# Patient Record
Sex: Male | Born: 1960 | Hispanic: No | Smoking: Never smoker
Health system: Southern US, Community
[De-identification: ages and names within clinical notes are randomized; demographics above are authoritative.]

## PROBLEM LIST (undated history)

## (undated) DIAGNOSIS — I10 Essential (primary) hypertension: Secondary | ICD-10-CM

## (undated) DIAGNOSIS — E0590 Thyrotoxicosis, unspecified without thyrotoxic crisis or storm: Secondary | ICD-10-CM

## (undated) DIAGNOSIS — G4730 Sleep apnea, unspecified: Secondary | ICD-10-CM

## (undated) HISTORY — PX: APPENDECTOMY: SHX54

---

## 2018-06-01 ENCOUNTER — Emergency Department (HOSPITAL_COMMUNITY): Payer: No Typology Code available for payment source

## 2018-06-01 ENCOUNTER — Other Ambulatory Visit: Payer: Self-pay

## 2018-06-01 ENCOUNTER — Observation Stay (HOSPITAL_COMMUNITY): Payer: No Typology Code available for payment source

## 2018-06-01 ENCOUNTER — Emergency Department (HOSPITAL_COMMUNITY): Payer: No Typology Code available for payment source | Admitting: Certified Registered"

## 2018-06-01 ENCOUNTER — Observation Stay (HOSPITAL_COMMUNITY)
Admission: EM | Admit: 2018-06-01 | Discharge: 2018-06-02 | Disposition: A | Discharge status: Home / Self Care | Payer: No Typology Code available for payment source | Attending: Orthopedic Surgery | Admitting: Orthopedic Surgery

## 2018-06-01 ENCOUNTER — Inpatient Hospital Stay: Admit: 2018-06-01 | Payer: Self-pay | Admitting: Orthopedic Surgery

## 2018-06-01 ENCOUNTER — Encounter (HOSPITAL_COMMUNITY): Payer: Self-pay

## 2018-06-01 ENCOUNTER — Encounter (HOSPITAL_COMMUNITY)
Admission: EM | Disposition: A | Discharge status: Home / Self Care | Payer: Self-pay | Source: Home / Self Care | Attending: Emergency Medicine

## 2018-06-01 DIAGNOSIS — S89101A Unspecified physeal fracture of lower end of right tibia, initial encounter for closed fracture: Secondary | ICD-10-CM | POA: Diagnosis present

## 2018-06-01 DIAGNOSIS — Y990 Civilian activity done for income or pay: Secondary | ICD-10-CM | POA: Diagnosis not present

## 2018-06-01 DIAGNOSIS — S82401A Unspecified fracture of shaft of right fibula, initial encounter for closed fracture: Secondary | ICD-10-CM | POA: Diagnosis not present

## 2018-06-01 DIAGNOSIS — Z79899 Other long term (current) drug therapy: Secondary | ICD-10-CM | POA: Diagnosis not present

## 2018-06-01 DIAGNOSIS — I10 Essential (primary) hypertension: Secondary | ICD-10-CM | POA: Insufficient documentation

## 2018-06-01 DIAGNOSIS — E039 Hypothyroidism, unspecified: Secondary | ICD-10-CM | POA: Diagnosis not present

## 2018-06-01 DIAGNOSIS — X501XXA Overexertion from prolonged static or awkward postures, initial encounter: Secondary | ICD-10-CM | POA: Diagnosis not present

## 2018-06-01 DIAGNOSIS — Z6834 Body mass index (BMI) 34.0-34.9, adult: Secondary | ICD-10-CM | POA: Diagnosis not present

## 2018-06-01 DIAGNOSIS — S82301A Unspecified fracture of lower end of right tibia, initial encounter for closed fracture: Principal | ICD-10-CM | POA: Insufficient documentation

## 2018-06-01 DIAGNOSIS — G4730 Sleep apnea, unspecified: Secondary | ICD-10-CM | POA: Insufficient documentation

## 2018-06-01 DIAGNOSIS — Z79890 Hormone replacement therapy (postmenopausal): Secondary | ICD-10-CM | POA: Insufficient documentation

## 2018-06-01 DIAGNOSIS — S82201A Unspecified fracture of shaft of right tibia, initial encounter for closed fracture: Secondary | ICD-10-CM | POA: Diagnosis not present

## 2018-06-01 DIAGNOSIS — Z7982 Long term (current) use of aspirin: Secondary | ICD-10-CM | POA: Diagnosis not present

## 2018-06-01 DIAGNOSIS — Z419 Encounter for procedure for purposes other than remedying health state, unspecified: Secondary | ICD-10-CM

## 2018-06-01 DIAGNOSIS — S82209A Unspecified fracture of shaft of unspecified tibia, initial encounter for closed fracture: Secondary | ICD-10-CM | POA: Diagnosis present

## 2018-06-01 HISTORY — DX: Essential (primary) hypertension: I10

## 2018-06-01 HISTORY — DX: Sleep apnea, unspecified: G47.30

## 2018-06-01 HISTORY — DX: Thyrotoxicosis, unspecified without thyrotoxic crisis or storm: E05.90

## 2018-06-01 HISTORY — PX: TIBIA IM NAIL INSERTION: SHX2516

## 2018-06-01 LAB — BASIC METABOLIC PANEL
ANION GAP: 9 (ref 5–15)
BUN: 10 mg/dL (ref 6–20)
CALCIUM: 9.1 mg/dL (ref 8.9–10.3)
CO2: 26 mmol/L (ref 22–32)
CREATININE: 1.06 mg/dL (ref 0.61–1.24)
Chloride: 106 mmol/L (ref 98–111)
GFR calc Af Amer: >60 mL/min (ref >60)
GFR calc non Af Amer: >60 mL/min (ref >60)
GLUCOSE: 112 mg/dL — AB (ref 70–99)
Potassium: 4.1 mmol/L (ref 3.5–5.1)
Sodium: 141 mmol/L (ref 135–145)

## 2018-06-01 LAB — CBC WITH DIFFERENTIAL/PLATELET
BASOS ABS: 0.1 10*3/uL (ref 0.0–0.1)
BASOS PCT: 1 %
EOS ABS: 0.1 10*3/uL (ref 0.0–0.7)
EOS PCT: 1 %
HCT: 46.2 % (ref 39.0–52.0)
Hemoglobin: 15.7 g/dL (ref 13.0–17.0)
Lymphocytes Relative: 11 %
Lymphs Abs: 1.2 10*3/uL (ref 0.7–4.0)
MCH: 31.2 pg (ref 26.0–34.0)
MCHC: 34 g/dL (ref 30.0–36.0)
MCV: 91.7 fL (ref 78.0–100.0)
MONO ABS: 0.6 10*3/uL (ref 0.1–1.0)
Monocytes Relative: 5 %
Neutro Abs: 9.1 10*3/uL — ABNORMAL HIGH (ref 1.7–7.7)
Neutrophils Relative %: 82 %
PLATELETS: 253 10*3/uL (ref 150–400)
RBC: 5.04 MIL/uL (ref 4.22–5.81)
RDW: 13.2 % (ref 11.5–15.5)
WBC: 11 10*3/uL — ABNORMAL HIGH (ref 4.0–10.5)

## 2018-06-01 LAB — PROTIME-INR
INR: 0.92
Prothrombin Time: 12.3 seconds (ref 11.4–15.2)

## 2018-06-01 SURGERY — INSERTION, INTRAMEDULLARY ROD, TIBIA
Anesthesia: General | Site: Leg Lower | Laterality: Right

## 2018-06-01 MED ORDER — OXYCODONE HCL 5 MG PO TABS
ORAL_TABLET | ORAL | Status: AC
  Filled 2018-06-01: qty 1

## 2018-06-01 MED ORDER — ONDANSETRON HCL 4 MG/2ML IJ SOLN
4.0000 mg | INTRAMUSCULAR | Status: DC | PRN
  Administered 2018-06-01: 4 mg via INTRAVENOUS
  Filled 2018-06-01: qty 2

## 2018-06-01 MED ORDER — DEXAMETHASONE SODIUM PHOSPHATE 10 MG/ML IJ SOLN
INTRAMUSCULAR | Status: DC | PRN
  Administered 2018-06-01: 10 mg via INTRAVENOUS

## 2018-06-01 MED ORDER — CEFAZOLIN SODIUM-DEXTROSE 2-4 GM/100ML-% IV SOLN
2.0000 g | Freq: Once | INTRAVENOUS | Status: AC
  Administered 2018-06-01: 2 g via INTRAVENOUS

## 2018-06-01 MED ORDER — SODIUM CHLORIDE 0.9 % IR SOLN
Status: DC | PRN
  Administered 2018-06-01 (×3): 1000 mL

## 2018-06-01 MED ORDER — LACTATED RINGERS IV SOLN
INTRAVENOUS | Status: DC
  Administered 2018-06-01 (×3): via INTRAVENOUS

## 2018-06-01 MED ORDER — SUCCINYLCHOLINE CHLORIDE 20 MG/ML IJ SOLN
INTRAMUSCULAR | Status: DC | PRN
  Administered 2018-06-01: 80 mg via INTRAVENOUS

## 2018-06-01 MED ORDER — CEFAZOLIN SODIUM-DEXTROSE 2-4 GM/100ML-% IV SOLN: 2.0000 g | INTRAVENOUS | Status: DC

## 2018-06-01 MED ORDER — HYDROMORPHONE HCL 1 MG/ML IJ SOLN
1.0000 mg | INTRAMUSCULAR | Status: DC | PRN
  Administered 2018-06-01: 1 mg via INTRAVENOUS
  Filled 2018-06-01: qty 1

## 2018-06-01 MED ORDER — SODIUM CHLORIDE 0.9 % IV SOLN: Freq: Once | INTRAVENOUS | Status: DC

## 2018-06-01 MED ORDER — FENTANYL CITRATE (PF) 100 MCG/2ML IJ SOLN
25.0000 ug | INTRAMUSCULAR | Status: DC | PRN
  Administered 2018-06-01 (×3): 25 ug via INTRAVENOUS

## 2018-06-01 MED ORDER — ONDANSETRON HCL 4 MG/2ML IJ SOLN
INTRAMUSCULAR | Status: DC | PRN
  Administered 2018-06-01: 4 mg via INTRAVENOUS

## 2018-06-01 MED ORDER — MIDAZOLAM HCL 2 MG/2ML IJ SOLN
INTRAMUSCULAR | Status: AC
  Filled 2018-06-01: qty 2

## 2018-06-01 MED ORDER — CLONIDINE HCL (ANALGESIA) 100 MCG/ML EP SOLN
150.0000 ug | Freq: Once | EPIDURAL | Status: DC
  Filled 2018-06-01: qty 10

## 2018-06-01 MED ORDER — OXYCODONE HCL 5 MG PO TABS
5.0000 mg | ORAL_TABLET | Freq: Once | ORAL | Status: AC | PRN
  Administered 2018-06-01: 5 mg via ORAL

## 2018-06-01 MED ORDER — OXYCODONE HCL 5 MG/5ML PO SOLN: 5.0000 mg | Freq: Once | ORAL | Status: AC | PRN

## 2018-06-01 MED ORDER — PROPOFOL 10 MG/ML IV BOLUS
INTRAVENOUS | Status: AC
  Filled 2018-06-01: qty 20

## 2018-06-01 MED ORDER — CEFAZOLIN SODIUM-DEXTROSE 2-4 GM/100ML-% IV SOLN
INTRAVENOUS | Status: AC
  Filled 2018-06-01: qty 100

## 2018-06-01 MED ORDER — CHLORHEXIDINE GLUCONATE 4 % EX LIQD: 60.0000 mL | Freq: Once | CUTANEOUS | Status: DC

## 2018-06-01 MED ORDER — FENTANYL CITRATE (PF) 100 MCG/2ML IJ SOLN
INTRAMUSCULAR | Status: DC | PRN
  Administered 2018-06-01: 50 ug via INTRAVENOUS
  Administered 2018-06-01 (×2): 100 ug via INTRAVENOUS

## 2018-06-01 MED ORDER — LIDOCAINE HCL (CARDIAC) PF 100 MG/5ML IV SOSY
PREFILLED_SYRINGE | INTRAVENOUS | Status: DC | PRN
  Administered 2018-06-01: 60 mg via INTRAVENOUS

## 2018-06-01 MED ORDER — BUPIVACAINE HCL (PF) 0.5 % IJ SOLN
INTRAMUSCULAR | Status: DC | PRN
  Administered 2018-06-01: 30 mL

## 2018-06-01 MED ORDER — BUPIVACAINE HCL (PF) 0.5 % IJ SOLN
INTRAMUSCULAR | Status: AC
  Filled 2018-06-01: qty 30

## 2018-06-01 MED ORDER — MIDAZOLAM HCL 5 MG/5ML IJ SOLN
INTRAMUSCULAR | Status: DC | PRN
  Administered 2018-06-01: 2 mg via INTRAVENOUS

## 2018-06-01 MED ORDER — PROPOFOL 10 MG/ML IV BOLUS
INTRAVENOUS | Status: DC | PRN
  Administered 2018-06-01: 20 mg via INTRAVENOUS
  Administered 2018-06-01: 120 mg via INTRAVENOUS

## 2018-06-01 MED ORDER — BUPIVACAINE HCL (PF) 0.25 % IJ SOLN
INTRAMUSCULAR | Status: AC
  Filled 2018-06-01: qty 30

## 2018-06-01 MED ORDER — POVIDONE-IODINE 10 % EX SWAB
2.0000 "application " | Freq: Once | CUTANEOUS | Status: AC
  Administered 2018-06-01: 2 via TOPICAL

## 2018-06-01 MED ORDER — FENTANYL CITRATE (PF) 100 MCG/2ML IJ SOLN
INTRAMUSCULAR | Status: AC
  Filled 2018-06-01: qty 2

## 2018-06-01 MED ORDER — FENTANYL CITRATE (PF) 250 MCG/5ML IJ SOLN
INTRAMUSCULAR | Status: AC
  Filled 2018-06-01: qty 5

## 2018-06-01 MED ORDER — MORPHINE SULFATE (PF) 4 MG/ML IV SOLN
INTRAVENOUS | Status: AC
  Filled 2018-06-01: qty 2

## 2018-06-01 SURGICAL SUPPLY — 73 items
BANDAGE ACE 4X5 VEL STRL LF (GAUZE/BANDAGES/DRESSINGS) ×3 IMPLANT
BANDAGE ACE 6X5 VEL STRL LF (GAUZE/BANDAGES/DRESSINGS) ×3 IMPLANT
BANDAGE ELASTIC 4 VELCRO ST LF (GAUZE/BANDAGES/DRESSINGS) ×2 IMPLANT
BANDAGE ESMARK 6X9 LF (GAUZE/BANDAGES/DRESSINGS) IMPLANT
BIT DRILL AO GAMMA 4.2X180 (BIT) ×4 IMPLANT
BIT DRILL AO GAMMA 4.2X340 (BIT) ×2 IMPLANT
BLADE CLIPPER SURG (BLADE) IMPLANT
BLADE SURG 15 STRL LF DISP TIS (BLADE) ×1 IMPLANT
BLADE SURG 15 STRL SS (BLADE) ×3
BNDG CMPR 9X6 STRL LF SNTH (GAUZE/BANDAGES/DRESSINGS)
BNDG COHESIVE 6X5 TAN STRL LF (GAUZE/BANDAGES/DRESSINGS) ×3 IMPLANT
BNDG ESMARK 6X9 LF (GAUZE/BANDAGES/DRESSINGS)
BNDG GAUZE ELAST 4 BULKY (GAUZE/BANDAGES/DRESSINGS) ×3 IMPLANT
COVER SURGICAL LIGHT HANDLE (MISCELLANEOUS) ×3 IMPLANT
CUFF TOURNIQUET SINGLE 34IN LL (TOURNIQUET CUFF) IMPLANT
CUFF TOURNIQUET SINGLE 44IN (TOURNIQUET CUFF) IMPLANT
DRAPE C-ARM 42X72 X-RAY (DRAPES) ×3 IMPLANT
DRAPE C-ARMOR (DRAPES) ×4 IMPLANT
DRAPE IMP U-DRAPE 54X76 (DRAPES) ×3 IMPLANT
DRAPE INCISE IOBAN 66X45 STRL (DRAPES) ×6 IMPLANT
DRAPE ORTHO SPLIT 77X108 STRL (DRAPES) ×6
DRAPE SURG ORHT 6 SPLT 77X108 (DRAPES) ×2 IMPLANT
DRAPE U-SHAPE 47X51 STRL (DRAPES) ×3 IMPLANT
DRSG TEGADERM 2-3/8X2-3/4 SM (GAUZE/BANDAGES/DRESSINGS) ×8 IMPLANT
DRSG TEGADERM 4X4.75 (GAUZE/BANDAGES/DRESSINGS) ×2 IMPLANT
DURAPREP 26ML APPLICATOR (WOUND CARE) ×3 IMPLANT
ELECT REM PT RETURN 9FT ADLT (ELECTROSURGICAL) ×3
ELECTRODE REM PT RTRN 9FT ADLT (ELECTROSURGICAL) ×1 IMPLANT
END CAP TIBIAL T2 11.5X5 (CAP) ×3 IMPLANT
GAUZE SPONGE 4X4 12PLY STRL (GAUZE/BANDAGES/DRESSINGS) ×6 IMPLANT
GAUZE SPONGE 4X4 12PLY STRL LF (GAUZE/BANDAGES/DRESSINGS) ×2 IMPLANT
GAUZE XEROFORM 5X9 LF (GAUZE/BANDAGES/DRESSINGS) ×3 IMPLANT
GLOVE BIOGEL PI IND STRL 8 (GLOVE) ×1 IMPLANT
GLOVE BIOGEL PI INDICATOR 8 (GLOVE) ×2
GLOVE SURG ORTHO 7.0 STRL STRW (GLOVE) ×2 IMPLANT
GLOVE SURG ORTHO 8.0 STRL STRW (GLOVE) ×3 IMPLANT
GOWN STRL REUS W/ TWL LRG LVL3 (GOWN DISPOSABLE) ×1 IMPLANT
GOWN STRL REUS W/ TWL XL LVL3 (GOWN DISPOSABLE) ×2 IMPLANT
GOWN STRL REUS W/TWL LRG LVL3 (GOWN DISPOSABLE) ×3
GOWN STRL REUS W/TWL XL LVL3 (GOWN DISPOSABLE) ×6
GUIDEROD T2 3X1000 (ROD) ×2 IMPLANT
GUIDEWIRE GAMMA (WIRE) ×4 IMPLANT
K-WIRE FIXATION 3X285 COATED (WIRE) ×6
KIT BASIN OR (CUSTOM PROCEDURE TRAY) ×3 IMPLANT
KIT TURNOVER KIT B (KITS) ×3 IMPLANT
KWIRE FIXATION 3X285 COATED (WIRE) IMPLANT
MANIFOLD NEPTUNE II (INSTRUMENTS) ×3 IMPLANT
NAIL ELAS INSERT SLV SPI 8-11 (MISCELLANEOUS) ×2 IMPLANT
NAIL TIBIAL STD 11X360MM (Nail) ×2 IMPLANT
NS IRRIG 1000ML POUR BTL (IV SOLUTION) ×3 IMPLANT
PACK GENERAL/GYN (CUSTOM PROCEDURE TRAY) ×3 IMPLANT
PACK UNIVERSAL I (CUSTOM PROCEDURE TRAY) ×3 IMPLANT
PAD ARMBOARD 7.5X6 YLW CONV (MISCELLANEOUS) ×6 IMPLANT
PAD CAST 4YDX4 CTTN HI CHSV (CAST SUPPLIES) IMPLANT
PADDING CAST COTTON 4X4 STRL (CAST SUPPLIES) ×3
REAMER INTRAMEDULLARY 8MM 510 (MISCELLANEOUS) ×2 IMPLANT
SCREW LOCKING T2 F/T  5MMX40MM (Screw) ×2 IMPLANT
SCREW LOCKING T2 F/T  5MMX50MM (Screw) ×4 IMPLANT
SCREW LOCKING T2 F/T  5MMX55MM (Screw) ×2 IMPLANT
SCREW LOCKING T2 F/T 5MMX40MM (Screw) IMPLANT
SCREW LOCKING T2 F/T 5MMX50MM (Screw) IMPLANT
SCREW LOCKING T2 F/T 5MMX55MM (Screw) IMPLANT
SPLINT PLASTER CAST XFAST 5X30 (CAST SUPPLIES) IMPLANT
SPLINT PLASTER XFAST SET 5X30 (CAST SUPPLIES) ×4
STOCKINETTE IMPERVIOUS LG (DRAPES) ×3 IMPLANT
SUT ETHILON 3 0 PS 1 (SUTURE) ×4 IMPLANT
SUT VIC AB 0 CT1 27 (SUTURE) ×6
SUT VIC AB 0 CT1 27XBRD ANBCTR (SUTURE) ×2 IMPLANT
SUT VIC AB 2-0 CT1 27 (SUTURE) ×3
SUT VIC AB 2-0 CT1 TAPERPNT 27 (SUTURE) ×1 IMPLANT
TOWEL OR 17X26 10 PK STRL BLUE (TOWEL DISPOSABLE) ×3 IMPLANT
TRAY FOLEY MTR SLVR 16FR STAT (SET/KITS/TRAYS/PACK) IMPLANT
WATER STERILE IRR 1000ML POUR (IV SOLUTION) ×3 IMPLANT

## 2018-06-01 NOTE — Anesthesia Procedure Notes (Signed)
Procedure Name: Intubation Date/Time: 06/01/2018 8:21 PM Performed by: Babs Bertin, CRNA Pre-anesthesia Checklist: Patient identified, Emergency Drugs available, Suction available and Patient being monitored Patient Re-evaluated:Patient Re-evaluated prior to induction Oxygen Delivery Method: Circle System Utilized Preoxygenation: Pre-oxygenation with 100% oxygen Induction Type: IV induction and Rapid sequence Laryngoscope Size: Mac and 4 Grade View: Grade III Tube type: Oral Tube size: 7.5 mm Number of attempts: 1 Airway Equipment and Method: Stylet and Oral airway Placement Confirmation: ETT inserted through vocal cords under direct vision,  positive ETCO2 and breath sounds checked- equal and bilateral Secured at: 22 cm Tube secured with: Tape Dental Injury: Teeth and Oropharynx as per pre-operative assessment

## 2018-06-01 NOTE — Anesthesia Preprocedure Evaluation (Signed)
Anesthesia Evaluation  Patient identified by MRN, date of birth, ID band Patient awake    Reviewed: Allergy & Precautions, NPO status , Patient's Chart, lab work & pertinent test results  History of Anesthesia Complications Negative for: history of anesthetic complications  Airway Mallampati: IV  TM Distance: >3 FB Neck ROM: Full    Dental  (+) Teeth Intact   Pulmonary sleep apnea and Continuous Positive Airway Pressure Ventilation ,    breath sounds clear to auscultation       Cardiovascular hypertension, Pt. on medications (-) angina(-) Past MI and (-) CHF  Rhythm:Regular     Neuro/Psych negative neurological ROS  negative psych ROS   GI/Hepatic negative GI ROS, Neg liver ROS,   Endo/Other  Hypothyroidism Morbid obesity  Renal/GU negative Renal ROS     Musculoskeletal Right tib/fib fx   Abdominal   Peds  Hematology   Anesthesia Other Findings   Reproductive/Obstetrics                             Anesthesia Physical Anesthesia Plan  ASA: II  Anesthesia Plan: General   Post-op Pain Management:    Induction: Intravenous  PONV Risk Score and Plan: 2 and Ondansetron and Dexamethasone  Airway Management Planned: Oral ETT  Additional Equipment: None  Intra-op Plan:   Post-operative Plan: Extubation in OR  Informed Consent: I have reviewed the patients History and Physical, chart, labs and discussed the procedure including the risks, benefits and alternatives for the proposed anesthesia with the patient or authorized representative who has indicated his/her understanding and acceptance.   Dental advisory given  Plan Discussed with: CRNA and Surgeon  Anesthesia Plan Comments:         Anesthesia Quick Evaluation

## 2018-06-01 NOTE — Brief Op Note (Signed)
06/01/2018  10:57 PM  PATIENT:  Carlos RoupVincent Adkins  57 y.o. male  PRE-OPERATIVE DIAGNOSIS:  RIGHT TIB/FIB FRACTURE  POST-OPERATIVE DIAGNOSIS:  RIGHT TIB/FIB FRACTURE  PROCEDURE:  Procedure(s): INTRAMEDULLARY (IM) NAIL TIBIAL  SURGEON:  Surgeon(s): August Saucerean, Corrie MckusickGregory Scott, MD  ASSISTANT: None  ANESTHESIA:   general  EBL: 100 ml    Total I/O In: 1700 [I.V.:1700] Out: 350 [Urine:300; Blood:50]  BLOOD ADMINISTERED: none  DRAINS: none   LOCAL MEDICATIONS USED: Plain Marcaine morphine clonidine  SPECIMEN:  No Specimen  COUNTS:  YES  TOURNIQUET:  * Missing tourniquet times found for documented tourniquets in log: [id number] *  DICTATION: . [id number]. [id number] PLAN OF CARE: Admit for overnight observation  PATIENT DISPOSITION:  PACU - hemodynamically stable

## 2018-06-01 NOTE — H&P (Signed)
Carlos Adkins is an 57 y.o. male.   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  He was getting out of his truck and he twisted his right leg.  The right leg was pinned between tanks and he felt a pop.  Subsequent evaluation in the emergency room demonstrates spiral tib-fib fracture which is displaced.  He denies any numbness and tingling in his toes.  No family or personal history of DVT or pulmonary embolism.  He does take 1 baby aspirin twice a day.  Past Medical History:  Diagnosis Date  . Hypertension   . Hyperthyroidism   . Sleep apnea     Past Surgical History:  Procedure Laterality Date  . APPENDECTOMY      History reviewed. No pertinent family history. Social History:  reports that he has never smoked. He has never used smokeless tobacco. His alcohol and drug histories are not on file.  Allergies: No Known Allergies  Medications Prior to Admission  Medication Sig Dispense Refill  . aspirin EC 81 MG tablet Take 162 mg by mouth at bedtime.    Marland Kitchen GARLIC PO Take 2 capsules by mouth daily.    Marland Kitchen levothyroxine (SYNTHROID, LEVOTHROID) 137 MCG tablet Take 137 mcg by mouth daily.  0  . metoprolol succinate (TOPROL-XL) 25 MG 24 hr tablet Take 25 mg by mouth every evening.   6    Results for orders placed or performed during the hospital encounter of 06/01/18 (from the past 48 hour(s))  Basic metabolic panel     Status: Abnormal   Collection Time: 06/01/18  6:08 PM  Result Value Ref Range   Sodium 141 135 - 145 mmol/L   Potassium 4.1 3.5 - 5.1 mmol/L   Chloride 106 98 - 111 mmol/L    Comment: Please note change in reference range.   CO2 26 22 - 32 mmol/L   Glucose, Bld 112 (H) 70 - 99 mg/dL    Comment: Please note change in reference range.   BUN 10 6 - 20 mg/dL    Comment: Please note change in reference range.   Creatinine, Ser 1.06 0.61 - 1.24 mg/dL   Calcium 9.1 8.9 - 10.3 mg/dL   GFR calc  non Af Amer >60 >60 mL/min   GFR calc Af Amer >60 >60 mL/min    Comment: (NOTE) The eGFR has been calculated using the CKD EPI equation. This calculation has not been validated in all clinical situations. eGFR's persistently <60 mL/min signify possible Chronic Kidney Disease.    Anion gap 9 5 - 15    Comment: Performed at [facility], [place] [address], [place], [place] [address]  CBC with Differential     Status: Abnormal   Collection Time: 06/01/18  6:08 PM  Result Value Ref Range   WBC 11.0 (H) 4.0 - 10.5 K/uL   RBC 5.04 4.22 - 5.81 MIL/uL   Hemoglobin 15.7 13.0 - 17.0 g/dL   HCT 46.2 39.0 - 52.0 %   MCV 91.7 78.0 - 100.0 fL   MCH 31.2 26.0 - 34.0 pg   MCHC 34.0 30.0 - 36.0 g/dL   RDW 13.2 11.5 - 15.5 %   Platelets 253 150 - 400 K/uL   Neutrophils Relative % 82 %   Neutro Abs 9.1 (H) 1.7 - 7.7 K/uL   Lymphocytes Relative 11 %   Lymphs Abs 1.2 0.7 - 4.0 K/uL  Monocytes Relative 5 %   Monocytes Absolute 0.6 0.1 - 1.0 K/uL   Eosinophils Relative 1 %   Eosinophils Absolute 0.1 0.0 - 0.7 K/uL   Basophils Relative 1 %   Basophils Absolute 0.1 0.0 - 0.1 K/uL    Comment: Performed at [facility], [place] [address] [place], [place] [address]  Protime-INR     Status: None   Collection Time: 06/01/18  6:08 PM  Result Value Ref Range   Prothrombin Time 12.3 11.4 - 15.2 seconds   INR 0.92     Comment: Performed at [facility], [place] [address], [place], [place] [address]   Dg Tibia/fibula Right  Result Date: 06/01/2018 CLINICAL DATA:  Fall, leg injury EXAM: RIGHT TIBIA AND FIBULA - 2 VIEW COMPARISON:  None. FINDINGS: Mid fibular shaft fracture, with 1 shaft width medial displacement of the distal fracture fragment and apex medial angulation. Distal tibial shaft fracture, with approximately 1/2 shaft width lateral displacement of the distal fracture fragment. Mild soft tissue swelling. IMPRESSION: Fibular and tibial shaft fractures,  as above. Electronically Signed   By: Julian Hy M.D.   On: 06/01/2018 16:56   Dg Ankle Complete Right  Result Date: 06/01/2018 CLINICAL DATA:  Fall, right leg injury EXAM: RIGHT ANKLE - COMPLETE 3+ VIEW COMPARISON:  None. FINDINGS: Displaced oblique fracture of the distal tibial metaphysis. Approximately 1/2 shaft with lateral displacement of the distal fracture fragment. The ankle mortise is intact. The base of the fifth metatarsal is unremarkable. The visualized soft tissues are unremarkable. IMPRESSION: Oblique distal tibial fracture, better evaluated on dedicated tib/fib radiographs. Electronically Signed   By: Julian Hy M.D.   On: 06/01/2018 16:55   Dg Chest Port 1 View  Result Date: 06/01/2018 CLINICAL DATA:  Preop for lower extremity fracture EXAM: PORTABLE CHEST 1 VIEW COMPARISON:  None. FINDINGS: The heart size and mediastinal contours are within normal limits. Both lungs are clear. The visualized skeletal structures are unremarkable. IMPRESSION: No active disease. Electronically Signed   By: Ulyses Jarred M.D.   On: 06/01/2018 18:01    Review of Systems  Musculoskeletal: Positive for joint pain.  All other systems reviewed and are negative.   Blood pressure (!) 134/93, pulse (!) 133, temperature 98.4 F (36.9 C), temperature source Oral, resp. rate (!) 23, height 6' 1"  (1.854 m), weight 262 lb (118.8 kg), SpO2 98 %. Physical Exam  Constitutional: He appears well-developed.  HENT:  Head: Normocephalic.  Eyes: Pupils are equal, round, and reactive to light.  Neck: Normal range of motion.  Cardiovascular: Normal rate.  Respiratory: Effort normal.  Neurological: He is alert.  Skin: Skin is warm.  Psychiatric: He has a normal mood and affect.    Ortho exam demonstrates splinted right lower extremity.  No pain with passive dorsiflexion plantarflexion of the toes.  Foot is perfused and sensate on the dorsal and plantar aspect.  No groin pain on the right with  internal/external rotation of the leg. Assessment/Plan Impression is displaced right tib-fib fracture.  Plan is intramedullary nailing.  Risk and benefits are discussed including but not limited to infection nerve vessel damage nonunion malunion as well as need for more surgery.  Patient understands risk benefits.  All questions answered  Anderson Malta, MD 06/01/2018, 7:52 PM

## 2018-06-01 NOTE — ED Notes (Signed)
Bed: WA06 Expected date:  Expected time:  Means of arrival:  Comments: 56 Ankle Injury

## 2018-06-01 NOTE — Transfer of Care (Signed)
Immediate Anesthesia Transfer of Care Note  Patient: Carlos Adkins  Procedure(s) Performed: INTRAMEDULLARY (IM) NAIL TIBIAL (Right Leg Lower)  Patient Location: PACU  Anesthesia Type:General  Level of Consciousness: responds to stimulation  Airway & Oxygen Therapy: Patient Spontanous Breathing and Patient connected to nasal cannula oxygen  Post-op Assessment: Report given to RN and Post -op Vital signs reviewed and stable  Post vital signs: Reviewed and stable  Last Vitals:  Vitals Value Taken Time  BP 118/71 06/01/2018 10:50 PM  Temp 36.1 C 06/01/2018 10:48 PM  Pulse 79 06/01/2018 10:53 PM  Resp 12 06/01/2018 10:53 PM  SpO2 94 % 06/01/2018 10:53 PM  Vitals shown include unvalidated device data.  Last Pain:  Vitals:   06/01/18 1835  TempSrc:   PainSc: 6          Complications: No apparent anesthesia complications

## 2018-06-01 NOTE — ED Triage Notes (Signed)
Patient BIB EMS from work at [employer]. 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 Palpable equal pulses to bilateral pedal pulses.

## 2018-06-01 NOTE — ED Provider Notes (Signed)
[facility]-EMERGENCY DEPT Provider Note   CSN: [id number] Arrival date & time: 06/01/18  1604     History   Chief Complaint Chief Complaint  Patient presents with  . Ankle Pain    HPI Carlos Adkins is a 57 y.o. male.  HPI Patient had been loading barrels into the bed of a pickup truck.  He was getting ready to jump off a truck and caught his right foot between 2 barrels which caused his leg to twist and him to fall to the edge of the truck.  He heard a snap and a pop in his leg.  He has pain in the mid leg.  A friend caught him.  He denies any significant fall or injury to any other area of his body.  Denies he struck his head.  No headache, no neck pain, no chest pain, no back pain.  Patient takes 2 baby aspirin daily.  No other anticoagulants.  Last ate a meal around 1230.  Past medical history: Hypertension Hypothyroidism Appendectomy  History reviewed. No pertinent past medical history.  There are no active problems to display for this patient.    The histories are not reviewed yet. Please review them in the "History" navigator section and refresh this SmartLink.      Home Medications    Prior to Admission medications   Medication Sig Start Date End Date Taking? Authorizing Provider  aspirin EC 81 MG tablet Take 162 mg by mouth at bedtime.   Yes [provider]  GARLIC PO Take 2 capsules by mouth daily.   Yes [provider]  levothyroxine (SYNTHROID, LEVOTHROID) 137 MCG tablet Take 137 mcg by mouth daily. 05/16/18  Yes [provider]  metoprolol succinate (TOPROL-XL) 25 MG 24 hr tablet Take 25 mg by mouth every evening.  04/08/18  Yes [provider]    Family History History reviewed. No pertinent family history.  Social History Social History   Tobacco Use  . Smoking status: Not on file  Substance Use Topics  . Alcohol use: Not on file  . Drug use: Not on file     Allergies   Patient has no  known allergies.   Review of Systems Review of Systems 10 Systems reviewed and are negative for acute change except as noted in the HPI.   Physical Exam Updated Vital Signs BP 121/75 (BP Location: Right Arm)   Pulse 78   Temp 98.4 F (36.9 C) (Oral)   Resp 17   Ht 6\' 1"  (1.854 m)   Wt 118.8 kg (262 lb)   SpO2 98%   BMI 34.57 kg/m   Physical Exam  Constitutional: He is oriented to person, place, and time. He appears well-developed and well-nourished.  HENT:  Head: Normocephalic and atraumatic.  Right Ear: External ear normal.  Left Ear: External ear normal.  Nose: Nose normal.  Mouth/Throat: Oropharynx is clear and moist.  Eyes: Pupils are equal, round, and reactive to light. EOM are normal.  Neck: Neck supple.  No C-spine tenderness to palpation.  Cardiovascular: Normal rate, regular rhythm, normal heart sounds and intact distal pulses.  Pulmonary/Chest: Effort normal and breath sounds normal. He exhibits no tenderness.  Abdominal: Soft. Bowel sounds are normal. He exhibits no distension. There is no tenderness.  Musculoskeletal: He exhibits no edema.  No thoracic or lumbar spine pain to palpation.  No contusions or abrasions to the back.  Normal range of motion bilateral upper extremities no injury.  Right lower  extremity has swelling and mild deformity at the mid lower leg.  2+ and symmetric dorsalis pedis pulses.  Neurological: He is alert and oriented to person, place, and time. He has normal strength. No cranial nerve deficit. He exhibits normal muscle tone. Coordination normal. GCS eye subscore is 4. GCS verbal subscore is 5. GCS motor subscore is 6.  Skin: Skin is warm, dry and intact.  Psychiatric: He has a normal mood and affect.     ED Treatments / Results  Labs (all labs ordered are listed, but only abnormal results are displayed) Labs Reviewed - No data to display  EKG None  Radiology Dg Tibia/fibula Right  Result Date: 06/01/2018 CLINICAL DATA:   Fall, leg injury EXAM: RIGHT TIBIA AND FIBULA - 2 VIEW COMPARISON:  None. FINDINGS: Mid fibular shaft fracture, with 1 shaft width medial displacement of the distal fracture fragment and apex medial angulation. Distal tibial shaft fracture, with approximately 1/2 shaft width lateral displacement of the distal fracture fragment. Mild soft tissue swelling. IMPRESSION: Fibular and tibial shaft fractures, as above. Electronically Signed   By: Charline BillsSriyesh  Krishnan M.D.   On: 06/01/2018 16:56   Dg Ankle Complete Right  Result Date: 06/01/2018 CLINICAL DATA:  Fall, right leg injury EXAM: RIGHT ANKLE - COMPLETE 3+ VIEW COMPARISON:  None. FINDINGS: Displaced oblique fracture of the distal tibial metaphysis. Approximately 1/2 shaft with lateral displacement of the distal fracture fragment. The ankle mortise is intact. The base of the fifth metatarsal is unremarkable. The visualized soft tissues are unremarkable. IMPRESSION: Oblique distal tibial fracture, better evaluated on dedicated tib/fib radiographs. Electronically Signed   By: Charline BillsSriyesh  Krishnan M.D.   On: 06/01/2018 16:55    Procedures Procedures (including critical care time)  Medications Ordered in ED Medications - No data to display   Initial Impression / Assessment and Plan / ED Course  I have reviewed the triage vital signs and the nursing notes.  Pertinent labs & imaging results that were available during my care of the patient were reviewed by me and considered in my medical decision making (see chart for details).  Clinical Course as of Jun 01 1729  Wed Jun 01, 2018  1714 Consult: Dr. Dorene GrebeScott Dean has called and requests images be sent to his phone for review.  Will advise after reviewing images.   [MP]    Clinical Course User Index [MP] Arby BarrettePfeiffer, Blanton Kardell, MD   Dr. August Saucerean has called back and [facility]  Request placement in long-leg posterior splint.  Initiate preop orders. Final Clinical  Impressions(s) / ED Diagnoses   Final diagnoses:  Closed fracture of right tibia and fibula, initial encounter  Patient presents with a fall with isolated tib-fib fracture.  She is neurovascularly intact.  No other associated injuries.  Patient has chronic medical illness of controlled hypertension and hypothyroidism.  He recently finished a course of doxycycline [facility].  He reports he has been asymptomatic since taking the doxycycline.  He has been well and active at baseline prior to this injury.  No history of adverse events with anesthesia.  Last surgery was 2 years ago for appendectomy.  Patient's last meal was at approximately 12:30 PM.  ED Discharge Orders    None       Arby BarrettePfeiffer, Bricen Victory, MD 06/01/18 [phone]

## 2018-06-01 NOTE — ED Notes (Addendum)
[facility] Called 

## 2018-06-02 ENCOUNTER — Encounter (HOSPITAL_COMMUNITY): Payer: Self-pay | Admitting: Orthopedic Surgery

## 2018-06-02 MED ORDER — METOPROLOL SUCCINATE ER 25 MG PO TB24: 25.0000 mg | ORAL_TABLET | Freq: Every evening | ORAL | Status: DC

## 2018-06-02 MED ORDER — METOCLOPRAMIDE HCL 5 MG PO TABS: 5.0000 mg | ORAL_TABLET | Freq: Three times a day (TID) | ORAL | Status: DC | PRN

## 2018-06-02 MED ORDER — ONDANSETRON HCL 4 MG/2ML IJ SOLN: 4.0000 mg | Freq: Four times a day (QID) | INTRAMUSCULAR | Status: DC | PRN

## 2018-06-02 MED ORDER — TRAMADOL HCL 50 MG PO TABS
50.0000 mg | ORAL_TABLET | Freq: Four times a day (QID) | ORAL | Status: DC
  Administered 2018-06-02 (×3): 50 mg via ORAL
  Filled 2018-06-02 (×3): qty 1

## 2018-06-02 MED ORDER — METHOCARBAMOL 500 MG PO TABS: 500.0000 mg | ORAL_TABLET | Freq: Four times a day (QID) | ORAL | 0 refills | Status: AC | PRN

## 2018-06-02 MED ORDER — METOCLOPRAMIDE HCL 5 MG/ML IJ SOLN: 5.0000 mg | Freq: Three times a day (TID) | INTRAMUSCULAR | Status: DC | PRN

## 2018-06-02 MED ORDER — DOCUSATE SODIUM 100 MG PO CAPS
100.0000 mg | ORAL_CAPSULE | Freq: Two times a day (BID) | ORAL | Status: DC
  Administered 2018-06-02: 100 mg via ORAL
  Filled 2018-06-02 (×2): qty 1

## 2018-06-02 MED ORDER — ASPIRIN EC 81 MG PO TBEC: 162.0000 mg | DELAYED_RELEASE_TABLET | Freq: Every day | ORAL | Status: DC

## 2018-06-02 MED ORDER — ONDANSETRON HCL 4 MG PO TABS: 4.0000 mg | ORAL_TABLET | Freq: Four times a day (QID) | ORAL | Status: DC | PRN

## 2018-06-02 MED ORDER — ACETAMINOPHEN 325 MG PO TABS: 325.0000 mg | ORAL_TABLET | Freq: Four times a day (QID) | ORAL | 0 refills | Status: AC | PRN

## 2018-06-02 MED ORDER — HYDROMORPHONE HCL 1 MG/ML IJ SOLN
0.5000 mg | INTRAMUSCULAR | Status: DC | PRN
  Administered 2018-06-02: 1 mg via INTRAVENOUS
  Filled 2018-06-02: qty 1

## 2018-06-02 MED ORDER — CEFAZOLIN SODIUM-DEXTROSE 2-4 GM/100ML-% IV SOLN
2.0000 g | Freq: Four times a day (QID) | INTRAVENOUS | Status: AC
  Administered 2018-06-02 (×2): 2 g via INTRAVENOUS
  Filled 2018-06-02 (×2): qty 100

## 2018-06-02 MED ORDER — LACTATED RINGERS IV SOLN
INTRAVENOUS | Status: AC
  Administered 2018-06-02: 01:00:00 via INTRAVENOUS

## 2018-06-02 MED ORDER — OXYCODONE HCL 5 MG PO TABS
5.0000 mg | ORAL_TABLET | ORAL | Status: DC | PRN
  Administered 2018-06-02: 10 mg via ORAL
  Filled 2018-06-02: qty 2

## 2018-06-02 MED ORDER — OXYCODONE HCL 5 MG PO TABS: 5.0000 mg | ORAL_TABLET | ORAL | 0 refills | Status: AC | PRN

## 2018-06-02 MED ORDER — DOCUSATE SODIUM 100 MG PO CAPS: 100.0000 mg | ORAL_CAPSULE | Freq: Two times a day (BID) | ORAL | 0 refills | Status: AC

## 2018-06-02 MED ORDER — ACETAMINOPHEN 325 MG PO TABS: 325.0000 mg | ORAL_TABLET | Freq: Four times a day (QID) | ORAL | Status: DC | PRN

## 2018-06-02 MED ORDER — METHOCARBAMOL 1000 MG/10ML IJ SOLN
500.0000 mg | Freq: Four times a day (QID) | INTRAVENOUS | Status: DC | PRN
  Filled 2018-06-02: qty 5

## 2018-06-02 MED ORDER — LEVOTHYROXINE SODIUM 25 MCG PO TABS
137.0000 ug | ORAL_TABLET | Freq: Every day | ORAL | Status: DC
  Administered 2018-06-02: 137 ug via ORAL
  Filled 2018-06-02: qty 1

## 2018-06-02 MED ORDER — METHOCARBAMOL 500 MG PO TABS
500.0000 mg | ORAL_TABLET | Freq: Four times a day (QID) | ORAL | Status: DC | PRN
  Administered 2018-06-02 (×2): 500 mg via ORAL
  Filled 2018-06-02 (×2): qty 1

## 2018-06-02 NOTE — Evaluation (Signed)
Physical Therapy Evaluation Patient Details Name: Carlos Adkins MRN: [id number] DOB: 30-Apr-1961 Today's Date: 06/02/2018   History of Present Illness  Pt is a 57 y/o male s/p R tibial IM nail secondary to a traumatic work related injury in which he sustained a R tib/fib fx. PMH including but not limited to HTN and sleep apnea.    Clinical Impression  Pt presented OOB sitting upright in recliner chair, awake and willing to participate in therapy session. Prior to admission, pt reported that he was independent with all functional mobility and ADLs. Pt lives with his spouse in a single level house with a level entry. Pt currently able to perform transfers with min guard and ambulate a short distance with RW and min guard for safety. Pt able to maintain TDWB R LE throughout independently. PT will continue to follow acutely to progress mobility as tolerated.    Follow Up Recommendations No PT follow up;Supervision - Intermittent    Equipment Recommendations  Rolling walker with 5" wheels    Recommendations for Other Services       Precautions / Restrictions Precautions Precautions: Fall Restrictions Weight Bearing Restrictions: Yes RLE Weight Bearing: Touchdown weight bearing      Mobility  Bed Mobility               General bed mobility comments: pt OOB in recliner chair upon arrival  Transfers Overall transfer level: Needs assistance Equipment used: Rolling walker (2 wheeled) Transfers: Sit to/from Stand Sit to Stand: Min guard         General transfer comment: increased time and effort, cueing for safe hand placement, min guard for safety  Ambulation/Gait Ambulation/Gait assistance: Min guard Gait Distance (Feet): 35 Feet Assistive device: Rolling walker (2 wheeled) Gait Pattern/deviations: (hop-to on L LE) Gait velocity: decreased Gait velocity interpretation: <1.31 ft/sec, indicative of household ambulator General Gait Details: pt able to maintain TDWB R LE  throughout independently; steady with RW but limited secondary to fatigue and R LE pain  Stairs            Wheelchair Mobility    Modified Rankin (Stroke Patients Only)       Balance Overall balance assessment: Needs assistance Sitting-balance support: Feet supported Sitting balance-Leahy Scale: Good     Standing balance support: During functional activity;Bilateral upper extremity supported;Single extremity supported Standing balance-Leahy Scale: Poor                               Pertinent Vitals/Pain Pain Assessment: 0-10 Pain Score: 4  Pain Location: R LE Pain Descriptors / Indicators: Sore Pain Intervention(s): Monitored during session;Repositioned    Home Living Family/patient expects to be discharged to:: Private residence Living Arrangements: Spouse/significant other Available Help at Discharge: Family Type of Home: House Home Access: Level entry     Home Layout: One level Home Equipment: Shower seat      Prior Function Level of Independence: Independent               Hand Dominance   Dominant Hand: Right    Extremity/Trunk Assessment   Upper Extremity Assessment Upper Extremity Assessment: Overall WFL for tasks assessed    Lower Extremity Assessment Lower Extremity Assessment: RLE deficits/detail RLE Deficits / Details: pt with decreased strength and ROM limitations secondary to post-op pain and weakness. Pt with sensation grossly intact. RLE: Unable to fully assess due to pain;Unable to fully assess due to immobilization  Communication   Communication: No difficulties  Cognition Arousal/Alertness: Awake/alert Behavior During Therapy: WFL for tasks assessed/performed Overall Cognitive Status: Within Functional Limits for tasks assessed                                        General Comments      Exercises Other Exercises Other Exercises: PT encouraged pt to perform R LE strengthening HEP  upon d/c including SLR, LAQ and flex/extend toes; pt agreeable   Assessment/Plan    PT Assessment Patient needs continued PT services  PT Problem List Decreased strength;Decreased activity tolerance;Decreased range of motion;Decreased balance;Decreased mobility;Decreased coordination;Decreased knowledge of use of DME;Decreased safety awareness;Decreased knowledge of precautions;Pain       PT Treatment Interventions DME instruction;Gait training;Stair training;Functional mobility training;Therapeutic activities;Therapeutic exercise;Balance training;Neuromuscular re-education;Patient/family education    PT Goals (Current goals can be found in the Care Plan section)  Acute Rehab PT Goals Patient Stated Goal: return to work, decrease pain PT Goal Formulation: With patient Time For Goal Achievement: 06/16/18 Potential to Achieve Goals: Good    Frequency Min 5X/week   Barriers to discharge        Co-evaluation               AM-PAC PT "6 Clicks" Daily Activity  Outcome Measure Difficulty turning over in bed (including adjusting bedclothes, sheets and blankets)?: None Difficulty moving from lying on back to sitting on the side of the bed? : None Difficulty sitting down on and standing up from a chair with arms (e.g., wheelchair, bedside commode, etc,.)?: Unable Help needed moving to and from a bed to chair (including a wheelchair)?: None Help needed walking in hospital room?: A Little Help needed climbing 3-5 steps with a railing? : A Little 6 Click Score: 19    End of Session Equipment Utilized During Treatment: Gait belt Activity Tolerance: Patient tolerated treatment well Patient left: in chair;with call bell/phone within reach;with family/visitor present Nurse Communication: Mobility status PT Visit Diagnosis: Other abnormalities of gait and mobility (R26.89);Pain Pain - Right/Left: Right Pain - part of body: Leg    Time: 1210-1224 PT Time Calculation (min) (ACUTE  ONLY): 14 min   Charges:   PT Evaluation $PT Eval Low Complexity: 1 Low     PT G Codes:        [place]rian ParkJennifer Dayrin Stallone, [place], DPT [phone]   Alessandra BevelsJennifer M Aahil Fredin 06/02/2018, 12:37 PM

## 2018-06-02 NOTE — Progress Notes (Signed)
[name] is a 57 y.o. male patient admitted from PACU awake, alert - oriented  X 4 - no acute distress noted.  VSS - Blood pressure 140/80, pulse 80, temperature 97.8 F (36.6 C), temperature source Oral, resp. rate 16, height 6\' 1"  (1.854 m), weight 118.8 kg (262 lb), SpO2 98 %.    IV in place, occlusive dsg intact without redness.      Will cont to eval and treat per MD orders.  Rolland PorterJosephine M Manny Vitolo, RN 06/02/2018 12:40 AM

## 2018-06-02 NOTE — Progress Notes (Signed)
Discharged home today accompanied by wife. Personal belongings,discharged instructions given to patient . Verbalized understanding of instructions. Front wheel walker with patient to take home

## 2018-06-02 NOTE — Op Note (Signed)
Carlos Adkins, Carlos Adkins MEDICAL RECORD [id number] ACCOUNT [id number] DATE OF BIRTH:1961-02-01 FACILITY: [facility] LOCATION: [facility] PHYSICIAN:GREGORY Diamantina ProvidenceS. DEAN, MD  OPERATIVE REPORT  DATE OF PROCEDURE:  06/01/2018  PREOPERATIVE DIAGNOSIS:  Right tibia-fibula fracture.  POSTOPERATIVE DIAGNOSIS:  Right tibia-fibula fracture.  PROCEDURE PERFORMED:  Intramedullary nailing, right tib-fib fracture.  SURGEON:  Cammy CopaGregory Scott Dean, MD  ASSISTANT:  None.  ANESTHESIA:  General.  INDICATIONS:  The patient is a 57 year old patient with right tib-fib fracture sustained today.  No evidence of compartment syndrome.  Fracture is closed.  He presents for operative management.  IMPLANTS UTILIZED:  Stryker nail 11 x 36 cm with 2 proximal and 2 distal interlocks.  PROCEDURE IN DETAIL:  The patient was brought to the operating room where general endotracheal anesthesia was induced.  Preoperative antibiotics administered.  Timeout was called.  The patient's right leg was prescrubbed with alcohol and Betadine,  allowed to air dry.  Prep DuraPrep solution and draped in sterile manner.  Ioban used to cover the entire operative field.  Timeout was called.  Incision made just about 3 fingerbreadths proximal to the patella.  The quad tendon was divided.  The Stryker  guide was then placed along the anterior tibia.  Guide pin was then placed.  Correct placement of the guide pin was confirmed in the AP and lateral planes using fluoroscopy.  At this time, proximal reaming was performed.  A guide pin was then placed  across the fracture site, which was reduced manually using traction and manual reduction techniques.  Two small incisions were made around the fracture sites so a clamp could be placed in order to better reduce the fracture.  At this time, a guide pin  was placed across the fracture and reaming was performed up to 12.5 mm.  An 11 x 36 mm nail was then placed with good maintenance of fracture alignment and good  rotation noted.  Two proximal interlocking screws were placed under fluoroscopic guidance,  two distal interlocking screws were also placed under fluoroscopic guidance.  Good fracture reduction, stability and alignment and restoration of length and rotation were achieved.  At this time, the fluoroscopic images were interpreted and found to be  adequate.  The interlocking incisions were then irrigated and closed using 2-0 Vicryl and 3-0 nylon.  The knee joint was thoroughly irrigated and then closed using 0 Vicryl suture to reapproximate the quad tendon followed by 0 Vicryl suture, 2-0 Vicryl  suture and 3-0 nylon.  Impervious dressings and a posterior splint were applied.  The patient tolerated the procedure well without immediate complications.  The patient was transferred to the recovery room in stable condition.  TN/NUANCE  D:06/01/2018 T:06/02/2018 JOB:[id number]

## 2018-06-02 NOTE — Care Management Note (Signed)
Case Management Note  Patient Details  Name: Carlos RoupVincent Adkins MRN: [id number] Date of Birth: 1960/11/23  Subjective/Objective:                    Action/Plan:   Expected Discharge Date:  06/02/18               Expected Discharge Plan:  Home/Self Care  In-House Referral:     Discharge planning Services  CM Consult  Post Acute Care Choice:  Durable Medical Equipment Choice offered to:  Patient, Spouse  DME Arranged:  Dan HumphreysWalker rolling DME Agency:  [facility]  HH Arranged:  NA HH Agency:  NA  Status of Service:  Completed, signed off  If discussed at Long Length of Stay Meetings, dates discussed:    Additional Comments:  Carlos Adkins, Carlos Doss Marie, RN 06/02/2018, 12:34 PM

## 2018-06-02 NOTE — Anesthesia Postprocedure Evaluation (Signed)
Anesthesia Post Note  Patient: Barrie Salata  Procedure(s) Performed: INTRAMEDULLARY (IM) NAIL TIBIAL (Right Leg Lower)     Patient location during evaluation: PACU Anesthesia Type: General Level of consciousness: awake and alert Pain management: pain level controlled Vital Signs Assessment: post-procedure vital signs reviewed and stable Respiratory status: spontaneous breathing, nonlabored ventilation, respiratory function stable and patient connected to nasal cannula oxygen Cardiovascular status: blood pressure returned to baseline and stable Postop Assessment: no apparent nausea or vomiting Anesthetic complications: no    Last Vitals:  Vitals:   06/02/18 0018 06/02/18 0521  BP: 140/80 122/77  Pulse: 80 88  Resp: 16 16  Temp: 36.6 C 36.5 C  SpO2: 98% 96%    Last Pain:  Vitals:   06/02/18 0600  TempSrc:   PainSc: 5                  Guynell Kleiber

## 2018-06-02 NOTE — Progress Notes (Signed)
Subjective: Pt stable - pain ok   Objective: Vital signs in last 24 hours: Temp:  [97 F (36.1 C)-98.4 F (36.9 C)] 97.7 F (36.5 C) (07/18 0521) Pulse Rate:  [68-133] 88 (07/18 0521) Resp:  [14-23] 16 (07/18 0521) BP: (107-140)/(58-93) 122/77 (07/18 0521) SpO2:  [92 %-99 %] 96 % (07/18 0521) Weight:  [262 lb (118.8 kg)] 262 lb (118.8 kg) (07/17 1654)  Intake/Output from previous day: 07/17 0701 - 07/18 0700 In: 2605.6 [P.O.:360; I.V.:2045.6; IV Piggyback:200] Out: 850 [Urine:800; Blood:50] Intake/Output this shift: Total I/O In: 360 [P.O.:360] Out: -   Exam:  Intact pulses distally Dorsiflexion/Plantar flexion intact Compartment soft  Labs: Recent Labs    06/01/18 1808  HGB 15.7   Recent Labs    06/01/18 1808  WBC 11.0*  RBC 5.04  HCT 46.2  PLT 253   Recent Labs    06/01/18 1808  NA 141  K 4.1  CL 106  CO2 26  BUN 10  CREATININE 1.06  GLUCOSE 112*  CALCIUM 9.1   Recent Labs    06/01/18 1808  INR 0.92    Assessment/Plan: Plan dc today - needs crutches   G WellPointScott Dean 06/02/2018, 10:47 AM

## 2018-06-06 ENCOUNTER — Telehealth (INDEPENDENT_AMBULATORY_CARE_PROVIDER_SITE_OTHER): Payer: Self-pay

## 2018-06-06 NOTE — Discharge Summary (Signed)
Physician Discharge Summary  Patient ID: Carlos Adkins MRN: [id number] DOB/AGE: 55-Aug-1962 55 y.o.  Admit date: 06/01/2018 Discharge date: 06/02/2018  Admission Diagnoses:  Active Problems:   Tibial fracture   Closed fracture of right fibula and tibia   Discharge Diagnoses:  Same  Surgeries: Procedure(s): INTRAMEDULLARY (IM) NAIL TIBIAL on 06/01/2018   Consultants:   Discharged Condition: Stable  Hospital Course: Carlos Adkins is an 57 y.o. male who was admitted 06/01/2018 with a chief complaint of  Chief Complaint  Patient presents with  . Ankle Pain  , and found to have a diagnosis of distal tibia fracture.  They were brought to the operating room on 06/01/2018 and underwent the above named procedures.  Patient tolerated the procedure well without immediate complications.  He was transferred to the recovery room in stable condition.  He mobilized well the following day and was discharged home in good condition.  He is discharged home on aspirin for DVT prophylaxis along with pain medicine.  He will be touchdown transfers for weightbearing in the splint and will follow-up with me in 1 week for change over to a fracture boot. Antibiotics given:  Anti-infectives (From admission, onward)   Start     Dose/Rate Route Frequency Ordered Stop   06/02/18 0600  ceFAZolin (ANCEF) IVPB 2g/100 mL premix  Status:  Discontinued     2 g 200 mL/hr over 30 Minutes Intravenous On call to O.R. 06/01/18 1951 06/01/18 2131   06/02/18 0200  ceFAZolin (ANCEF) IVPB 2g/100 mL premix     2 g 200 mL/hr over 30 Minutes Intravenous Every 6 hours 06/02/18 0015 06/02/18 0912   06/01/18 2000  ceFAZolin (ANCEF) IVPB 2g/100 mL premix     2 g 200 mL/hr over 30 Minutes Intravenous  Once 06/01/18 1949 06/01/18 2052   06/01/18 1948  ceFAZolin (ANCEF) 2-4 GM/100ML-% IVPB    Note to Pharmacy:  Aquilla Hacker   : cabinet override      06/01/18 1948 06/01/18 2022    .  Recent vital signs:  Vitals:   06/02/18  0521 06/02/18 1030  BP: 122/77 124/72  Pulse: 88 86  Resp: 16 16  Temp: 97.7 F (36.5 C) 98 F (36.7 C)  SpO2: 96% 97%    Recent laboratory studies:  Results for orders placed or performed during the hospital encounter of 06/01/18  Basic metabolic panel  Result Value Ref Range   Sodium 141 135 - 145 mmol/L   Potassium 4.1 3.5 - 5.1 mmol/L   Chloride 106 98 - 111 mmol/L   CO2 26 22 - 32 mmol/L   Glucose, Bld 112 (H) 70 - 99 mg/dL   BUN 10 6 - 20 mg/dL   Creatinine, Ser 4.09 0.61 - 1.24 mg/dL   Calcium 9.1 8.9 - 81.1 mg/dL   GFR calc non Af Amer >60 >60 mL/min   GFR calc Af Amer >60 >60 mL/min   Anion gap 9 5 - 15  CBC with Differential  Result Value Ref Range   WBC 11.0 (H) 4.0 - 10.5 K/uL   RBC 5.04 4.22 - 5.81 MIL/uL   Hemoglobin 15.7 13.0 - 17.0 g/dL   HCT 91.4 78.2 - 95.6 %   MCV 91.7 78.0 - 100.0 fL   MCH 31.2 26.0 - 34.0 pg   MCHC 34.0 30.0 - 36.0 g/dL   RDW 21.3 08.6 - 57.8 %   Platelets 253 150 - 400 K/uL   Neutrophils Relative % 82 %   Neutro Abs 9.1 (  H) 1.7 - 7.7 K/uL   Lymphocytes Relative 11 %   Lymphs Abs 1.2 0.7 - 4.0 K/uL   Monocytes Relative 5 %   Monocytes Absolute 0.6 0.1 - 1.0 K/uL   Eosinophils Relative 1 %   Eosinophils Absolute 0.1 0.0 - 0.7 K/uL   Basophils Relative 1 %   Basophils Absolute 0.1 0.0 - 0.1 K/uL  Protime-INR  Result Value Ref Range   Prothrombin Time 12.3 11.4 - 15.2 seconds   INR 0.92     Discharge Medications:   Allergies as of 06/02/2018   No Known Allergies     Medication List    TAKE these medications   acetaminophen 325 MG tablet Commonly known as:  TYLENOL Take 1-2 tablets (325-650 mg total) by mouth every 6 (six) hours as needed for mild pain (pain score 1-3 or temp > 100.5).   aspirin EC 81 MG tablet Take 162 mg by mouth at bedtime.   docusate sodium 100 MG capsule Commonly known as:  COLACE Take 1 capsule (100 mg total) by mouth 2 (two) times daily.   GARLIC PO Take 2 capsules by mouth daily.    levothyroxine 137 MCG tablet Commonly known as:  SYNTHROID, LEVOTHROID Take 137 mcg by mouth daily.   methocarbamol 500 MG tablet Commonly known as:  ROBAXIN Take 1 tablet (500 mg total) by mouth every 6 (six) hours as needed for muscle spasms.   metoprolol succinate 25 MG 24 hr tablet Commonly known as:  TOPROL-XL Take 25 mg by mouth every evening.   oxyCODONE 5 MG immediate release tablet Commonly known as:  Oxy IR/ROXICODONE Take 1-2 tablets (5-10 mg total) by mouth every 4 (four) hours as needed for moderate pain (pain score 4-6).       Diagnostic Studies: Dg Tibia/fibula Right  Result Date: 06/01/2018 CLINICAL DATA:  Right intramedullary tibial nail fixation. EXAM: RIGHT TIBIA AND FIBULA - 2 VIEW; DG C-ARM 61-120 MIN COMPARISON:  Preoperative study from earlier on the same day. FINDINGS: Six C-arm fluoroscopic images were acquired intraoperatively after placement of a right intramedullary nail across a distal tibial diaphyseal fracture. Improved alignment is noted of the fracture. A slightly displaced midshaft fracture of the fibula is partly included on this study. No immediate intraoperative complications. A total of 2 minutes 28 seconds of fluoroscopic time was utilized after radiation safety time-out procedure performed. IMPRESSION: Fluoroscopic time utilized for placement of an intramedullary nail across a distal diaphyseal fracture of the right tibia. Slightly displaced fibular fracture is partially included on the images provided. No immediate intraoperative complications post IM nail fixation. Improved alignment is demonstrated. Electronically Signed   By: Tollie Eth M.D.   On: 06/01/2018 23:47   Dg Tibia/fibula Right  Result Date: 06/01/2018 CLINICAL DATA:  Fall, leg injury EXAM: RIGHT TIBIA AND FIBULA - 2 VIEW COMPARISON:  None. FINDINGS: Mid fibular shaft fracture, with 1 shaft width medial displacement of the distal fracture fragment and apex medial angulation. Distal  tibial shaft fracture, with approximately 1/2 shaft width lateral displacement of the distal fracture fragment. Mild soft tissue swelling. IMPRESSION: Fibular and tibial shaft fractures, as above. Electronically Signed   By: Charline Bills M.D.   On: 06/01/2018 16:56   Dg Ankle Complete Right  Result Date: 06/01/2018 CLINICAL DATA:  Fall, right leg injury EXAM: RIGHT ANKLE - COMPLETE 3+ VIEW COMPARISON:  None. FINDINGS: Displaced oblique fracture of the distal tibial metaphysis. Approximately 1/2 shaft with lateral displacement of the distal fracture fragment.  The ankle mortise is intact. The base of the fifth metatarsal is unremarkable. The visualized soft tissues are unremarkable. IMPRESSION: Oblique distal tibial fracture, better evaluated on dedicated tib/fib radiographs. Electronically Signed   By: Charline Bills M.D.   On: 06/01/2018 16:55   Dg Chest Port 1 View  Result Date: 06/01/2018 CLINICAL DATA:  Preop for lower extremity fracture EXAM: PORTABLE CHEST 1 VIEW COMPARISON:  None. FINDINGS: The heart size and mediastinal contours are within normal limits. Both lungs are clear. The visualized skeletal structures are unremarkable. IMPRESSION: No active disease. Electronically Signed   By: Deatra Robinson M.D.   On: 06/01/2018 18:01   Dg Tibia/fibula Right Port  Result Date: 06/02/2018 CLINICAL DATA:  Postop tibial fracture fixation EXAM: PORTABLE RIGHT TIBIA AND FIBULA - 2 VIEW COMPARISON:  Same day preoperative study FINDINGS: New intramedullary nail fixation across an oblique distal diaphyseal fracture of the tibia is identified with improved alignment. Similarly a midshaft fracture of the fibula is demonstrated with improved alignment status post reduction. No immediate postoperative complication is seen. Plaster splint is noted posteromedially. IMPRESSION: Status post IM nail fixation across distal tibial diaphyseal fracture with improved alignment. Similarly a midshaft fracture of the  fibula demonstrates improved alignment post reduction. Electronically Signed   By: Tollie Eth M.D.   On: 06/02/2018 00:15   Dg C-arm 1-60 Min  Result Date: 06/02/2018 CLINICAL DATA:  IM nail fixation of the right tibia. EXAM: DG C-ARM 61-120 MIN COMPARISON:  Preoperative study from earlier on the same day. FINDINGS: Radiation safety time-out was performed. 2 minutes 28 seconds of fluoroscopic time was utilized for intramedullary nail fixation across a distal diaphyseal fracture of the tibia. Improved alignment is noted. Midshaft fracture of the fibula is incompletely included on this study. IMPRESSION: Fluoroscopic time utilized for IM nail fixation of distal tibial diaphyseal fracture. Improved alignment. No immediate intraoperative complications. Partially included midshaft fracture of the fibula. Electronically Signed   By: Tollie Eth M.D.   On: 06/02/2018 00:06   Dg C-arm 1-60 Min  Result Date: 06/01/2018 CLINICAL DATA:  Right intramedullary tibial nail fixation. EXAM: RIGHT TIBIA AND FIBULA - 2 VIEW; DG C-ARM 61-120 MIN COMPARISON:  Preoperative study from earlier on the same day. FINDINGS: Six C-arm fluoroscopic images were acquired intraoperatively after placement of a right intramedullary nail across a distal tibial diaphyseal fracture. Improved alignment is noted of the fracture. A slightly displaced midshaft fracture of the fibula is partly included on this study. No immediate intraoperative complications. A total of 2 minutes 28 seconds of fluoroscopic time was utilized after radiation safety time-out procedure performed. IMPRESSION: Fluoroscopic time utilized for placement of an intramedullary nail across a distal diaphyseal fracture of the right tibia. Slightly displaced fibular fracture is partially included on the images provided. No immediate intraoperative complications post IM nail fixation. Improved alignment is demonstrated. Electronically Signed   By: Tollie Eth M.D.   On: 06/01/2018  23:47    Disposition:   Discharge Instructions    Call MD / Call 911   Complete by:  As directed    If you experience chest pain or shortness of breath, CALL 911 and be transported to the hospital emergency room.  If you develope a fever above 101 F, pus (white drainage) or increased drainage or redness at the wound, or calf pain, call your surgeon's office.   Constipation Prevention   Complete by:  As directed    Drink plenty of fluids.  Prune juice may  be helpful.  You may use a stool softener, such as Colace (over the counter) 100 mg twice a day.  Use MiraLax (over the counter) for constipation as needed.   Diet - low sodium heart healthy   Complete by:  As directed    Discharge instructions   Complete by:  As directed    Elevate leg Ok to touch down weight bear for transfers  [facility] for straight leg raises and knee range of motion exercises 30 reps each 3 times a day beginning today   Increase activity slowly as tolerated   Complete by:  As directed       Follow-up Information    August Saucerean, Corrie MckusickGregory Scott, MD Follow up.   Specialty:  Orthopedic Surgery Contact information: [address] [place] [address] [phone]            Signed: Burnard BuntingG Scott Onelia Cadmus 06/06/2018, 5:34 PM

## 2018-06-06 NOTE — Telephone Encounter (Signed)
faxed

## 2018-06-06 NOTE — Telephone Encounter (Signed)
Pt is having a hard time using the commode since it is so low. Case mgr requesting rx for 3 in 1 commode and she will take care of getting it for him.

## 2018-06-08 ENCOUNTER — Ambulatory Visit (INDEPENDENT_AMBULATORY_CARE_PROVIDER_SITE_OTHER): Payer: No Typology Code available for payment source

## 2018-06-08 ENCOUNTER — Encounter (INDEPENDENT_AMBULATORY_CARE_PROVIDER_SITE_OTHER): Payer: Self-pay | Admitting: Orthopedic Surgery

## 2018-06-08 ENCOUNTER — Ambulatory Visit (INDEPENDENT_AMBULATORY_CARE_PROVIDER_SITE_OTHER): Payer: No Typology Code available for payment source | Admitting: Orthopedic Surgery

## 2018-06-08 DIAGNOSIS — S82401D Unspecified fracture of shaft of right fibula, subsequent encounter for closed fracture with routine healing: Secondary | ICD-10-CM

## 2018-06-08 DIAGNOSIS — S82201D Unspecified fracture of shaft of right tibia, subsequent encounter for closed fracture with routine healing: Secondary | ICD-10-CM

## 2018-06-08 MED ORDER — HYDROCODONE-ACETAMINOPHEN 5-325 MG PO TABS: ORAL_TABLET | ORAL | 0 refills | Status: AC

## 2018-06-08 MED ORDER — METHOCARBAMOL 500 MG PO TABS: 500.0000 mg | ORAL_TABLET | Freq: Three times a day (TID) | ORAL | 0 refills | Status: DC | PRN

## 2018-06-10 ENCOUNTER — Encounter (INDEPENDENT_AMBULATORY_CARE_PROVIDER_SITE_OTHER): Payer: Self-pay | Admitting: Orthopedic Surgery

## 2018-06-10 NOTE — Progress Notes (Signed)
   Post-Op Visit Note   Patient: Carlos Adkins           Date of Birth: July 13, 1961           MRN: [id number] Visit Date: 06/08/2018 PCP: Evelene Croon, MD   Assessment & Plan:  Chief Complaint:  Chief Complaint  Patient presents with  . Right Leg - Routine Post Op   Visit Diagnoses:  1. Closed fracture of right tibia and fibula with routine healing, subsequent encounter     Plan: Carlos Adkins is a patient who is now 10 days out right tibial nail for fracture.  He is been doing well.  On exam he has mild right knee effusion and good range of motion.  Does have expected amount of swelling in the lower leg.  Negative Homans and no calf tenderness.  Plan is to really work on range of motion of the knee and ankle.  He is got to be out of the splint.  Come back on Monday for suture removal.  Norco and Robaxin prescribed.  After talking with the wife she is actually a nurse and I gave her a suture kit so she can take the sutures out next week.  I want him to be touchdown weightbearing in 2 weeks.  I will see him back in 3 weeks for repeat radiographs.  I do want him to do knee and ankle range of motion as much as possible.  Nurse case manager Carlos Adkins is present for discussion of treatment plan today.  Follow-Up Instructions: Return in about 3 weeks (around 06/29/2018).   Orders:  Orders Placed This Encounter  Procedures  . XR Tibia/Fibula Right   Meds ordered this encounter  Medications  . methocarbamol (ROBAXIN) 500 MG tablet    Sig: Take 1 tablet (500 mg total) by mouth every 8 (eight) hours as needed for muscle spasms.    Dispense:  30 tablet    Refill:  0  . HYDROcodone-acetaminophen (NORCO/VICODIN) 5-325 MG tablet    Sig: 1 po bid prn pain    Dispense:  45 tablet    Refill:  0    Imaging: No results found.  PMFS History: Patient Active Problem List   Diagnosis Date Noted  . Tibial fracture 06/01/2018  . Closed fracture of right fibula and tibia    Past Medical  History:  Diagnosis Date  . Hypertension   . Hyperthyroidism   . Sleep apnea     History reviewed. No pertinent family history.  Past Surgical History:  Procedure Laterality Date  . APPENDECTOMY    . TIBIA IM NAIL INSERTION Right 06/01/2018   Procedure: INTRAMEDULLARY (IM) NAIL TIBIAL;  Surgeon: Meredith Pel, MD;  Location: [facility];  Service: Orthopedics;  Laterality: Right;   Social History   Occupational History  . Not on file  Tobacco Use  . Smoking status: Never Smoker  . Smokeless tobacco: Never Used  Substance and Sexual Activity  . Alcohol use: Not on file  . Drug use: Not on file  . Sexual activity: Not on file

## 2018-06-23 ENCOUNTER — Encounter (INDEPENDENT_AMBULATORY_CARE_PROVIDER_SITE_OTHER): Payer: Self-pay

## 2018-06-23 ENCOUNTER — Telehealth (INDEPENDENT_AMBULATORY_CARE_PROVIDER_SITE_OTHER): Payer: Self-pay

## 2018-06-23 NOTE — Telephone Encounter (Signed)
Faxed note.

## 2018-06-23 NOTE — Telephone Encounter (Signed)
Case mgr left me vm stating that pt fell while leaving PT today. He is not hurt but he fell onto his walker and messed it up pretty bad so she is going to work on getting him a new one but will need order to do so through their vendor Fax to [phone]

## 2018-07-04 ENCOUNTER — Encounter (INDEPENDENT_AMBULATORY_CARE_PROVIDER_SITE_OTHER): Payer: Self-pay | Admitting: Orthopedic Surgery

## 2018-07-04 ENCOUNTER — Ambulatory Visit (INDEPENDENT_AMBULATORY_CARE_PROVIDER_SITE_OTHER): Payer: No Typology Code available for payment source | Admitting: Orthopedic Surgery

## 2018-07-04 ENCOUNTER — Ambulatory Visit (INDEPENDENT_AMBULATORY_CARE_PROVIDER_SITE_OTHER): Payer: No Typology Code available for payment source

## 2018-07-04 DIAGNOSIS — S82201D Unspecified fracture of shaft of right tibia, subsequent encounter for closed fracture with routine healing: Secondary | ICD-10-CM | POA: Diagnosis not present

## 2018-07-04 DIAGNOSIS — S82401D Unspecified fracture of shaft of right fibula, subsequent encounter for closed fracture with routine healing: Secondary | ICD-10-CM

## 2018-07-04 MED ORDER — METHOCARBAMOL 500 MG PO TABS: 500.0000 mg | ORAL_TABLET | Freq: Three times a day (TID) | ORAL | 0 refills | Status: DC | PRN

## 2018-07-04 MED ORDER — METHOCARBAMOL 500 MG PO TABS: 500.0000 mg | ORAL_TABLET | Freq: Three times a day (TID) | ORAL | 0 refills | Status: AC | PRN

## 2018-07-05 ENCOUNTER — Encounter (INDEPENDENT_AMBULATORY_CARE_PROVIDER_SITE_OTHER): Payer: Self-pay | Admitting: Orthopedic Surgery

## 2018-07-05 NOTE — Progress Notes (Signed)
   Post-Op Visit Note   Patient: Carlos Adkins           Date of Birth: [birth year]           MRN: [id number] Visit Date: 07/04/2018 PCP: Angelique Blonderordial, Lori Coe, MD   Assessment & Plan:  Chief Complaint:  Chief Complaint  Patient presents with  . Right Leg - Routine Post Op   Visit Diagnoses:  1. Closed fracture of right tibia and fibula with routine healing, subsequent encounter     Plan: Carlos Adkins presents for follow-up after right tib-fib fracture.  He has been doing well.  He has been achieving good knee range of motion.  On examination he has no calf tenderness.  Incisions are intact.  Ankle range of motion is improving on the right.  Radiographs look good but not too much in the way of callus formation is present yet.  Plan is to put him in a fracture boot and to move him from partial weightbearing of the next 2 weeks to full weightbearing.  I will see him back in 4 weeks for clinical check and repeat radiographs.  Therapy note written.  Follow-up with me in 4 weeks.  Nurse case manager present for discussion of treatment plan today.  He will need to be out of work at least 6 more weeks.  Follow-Up Instructions: Return in about 4 weeks (around 08/01/2018).   Orders:  Orders Placed This Encounter  Procedures  . XR Tibia/Fibula Right   Meds ordered this encounter  Medications  . DISCONTD: methocarbamol (ROBAXIN) 500 MG tablet    Sig: Take 1 tablet (500 mg total) by mouth every 8 (eight) hours as needed for muscle spasms.    Dispense:  30 tablet    Refill:  0  . methocarbamol (ROBAXIN) 500 MG tablet    Sig: Take 1 tablet (500 mg total) by mouth every 8 (eight) hours as needed for muscle spasms.    Dispense:  30 tablet    Refill:  0    Imaging: Xr Tibia/fibula Right  Result Date: 07/05/2018 AP lateral right tib-fib reviewed.  Intramedullary nail in good position and alignment.  Minimal callus formation noted across both the fibular fracture and tibia fracture.  No evidence of  hardware loosening in the interlocking screws.   PMFS History: Patient Active Problem List   Diagnosis Date Noted  . Tibial fracture 06/01/2018  . Closed fracture of right fibula and tibia    Past Medical History:  Diagnosis Date  . Hypertension   . Hyperthyroidism   . Sleep apnea     History reviewed. No pertinent family history.  Past Surgical History:  Procedure Laterality Date  . APPENDECTOMY    . TIBIA IM NAIL INSERTION Right 06/01/2018   Procedure: INTRAMEDULLARY (IM) NAIL TIBIAL;  Surgeon: Cammy Copaean, Peityn Payton Scott, MD;  [facility];  Service: Orthopedics;  Laterality: Right;   Social History   Occupational History  . Not on file  Tobacco Use  . Smoking status: Never Smoker  . Smokeless tobacco: Never Used  Substance and Sexual Activity  . Alcohol use: Not on file  . Drug use: Not on file  . Sexual activity: Not on file

## 2018-07-29 ENCOUNTER — Ambulatory Visit (INDEPENDENT_AMBULATORY_CARE_PROVIDER_SITE_OTHER): Payer: No Typology Code available for payment source | Admitting: Orthopedic Surgery

## 2018-07-29 ENCOUNTER — Encounter (INDEPENDENT_AMBULATORY_CARE_PROVIDER_SITE_OTHER): Payer: Self-pay | Admitting: Orthopedic Surgery

## 2018-07-29 ENCOUNTER — Ambulatory Visit (INDEPENDENT_AMBULATORY_CARE_PROVIDER_SITE_OTHER): Payer: No Typology Code available for payment source

## 2018-07-29 DIAGNOSIS — S82201D Unspecified fracture of shaft of right tibia, subsequent encounter for closed fracture with routine healing: Secondary | ICD-10-CM | POA: Diagnosis not present

## 2018-07-29 DIAGNOSIS — S82401D Unspecified fracture of shaft of right fibula, subsequent encounter for closed fracture with routine healing: Secondary | ICD-10-CM | POA: Diagnosis not present

## 2018-07-29 NOTE — Progress Notes (Signed)
   Post-Op Visit Note   Patient: Carlos Adkins           Date of Birth: February 26, 1961           MRN: [id number] Visit Date: 07/29/2018 PCP: Angelique Blonderordial, Lori Coe, MD   Assessment & Plan:  Chief Complaint:  Chief Complaint  Patient presents with  . Right Leg - Routine Post Op   Visit Diagnoses:  1. Closed fracture of right tibia and fibula with routine healing, subsequent encounter     Plan: Carlos Adkins is a patient who is now about 7 weeks out right intramedullary nail for tib-fib fracture.  He is been doing the bike in parallel bars.  Partial weightbearing with walker.  On exam he has no real tenderness over the distal tibia.  No calf tenderness.  Plan at this time is weightbearing as tolerated.  We will transition him with a walker to weightbearing as tolerated.  2-week return for clinical recheck.  Handicap sticker provided for 3 months.  Nurse case manager present for discussion of treatment plan.  Follow-Up Instructions: Return in about 2 weeks (around 08/12/2018).   Orders:  Orders Placed This Encounter  Procedures  . XR Tibia/Fibula Right   No orders of the defined types were placed in this encounter.   Imaging: Xr Tibia/fibula Right  Result Date: 07/29/2018 AP lateral right tib-fib reviewed.  Intramedullary nail transfixes distal spiral tibial shaft fracture.  There has been some callus formation noted in the fibular shaft.  Mild amount of callus formation present posteriorly at the tibia fracture.  No loosening of hardware distally.   PMFS History: Patient Active Problem List   Diagnosis Date Noted  . Tibial fracture 06/01/2018  . Closed fracture of right fibula and tibia    Past Medical History:  Diagnosis Date  . Hypertension   . Hyperthyroidism   . Sleep apnea     History reviewed. No pertinent family history.  Past Surgical History:  Procedure Laterality Date  . APPENDECTOMY    . TIBIA IM NAIL INSERTION Right 06/01/2018   Procedure: INTRAMEDULLARY (IM) NAIL  TIBIAL;  Surgeon: Cammy Copaean, Vergil Burby Scott, MD;  [facility];  Service: Orthopedics;  Laterality: Right;   Social History   Occupational History  . Not on file  Tobacco Use  . Smoking status: Never Smoker  . Smokeless tobacco: Never Used  Substance and Sexual Activity  . Alcohol use: Not on file  . Drug use: Not on file  . Sexual activity: Not on file

## 2018-08-03 ENCOUNTER — Telehealth (INDEPENDENT_AMBULATORY_CARE_PROVIDER_SITE_OTHER): Payer: Self-pay

## 2018-08-03 NOTE — Telephone Encounter (Signed)
Emailed the sept, Aug, and July office notes to the case mgr per her request

## 2018-08-12 ENCOUNTER — Ambulatory Visit (INDEPENDENT_AMBULATORY_CARE_PROVIDER_SITE_OTHER): Payer: No Typology Code available for payment source

## 2018-08-12 ENCOUNTER — Telehealth (INDEPENDENT_AMBULATORY_CARE_PROVIDER_SITE_OTHER): Payer: Self-pay

## 2018-08-12 ENCOUNTER — Ambulatory Visit (INDEPENDENT_AMBULATORY_CARE_PROVIDER_SITE_OTHER): Payer: No Typology Code available for payment source | Admitting: Orthopedic Surgery

## 2018-08-12 ENCOUNTER — Encounter (INDEPENDENT_AMBULATORY_CARE_PROVIDER_SITE_OTHER): Payer: Self-pay | Admitting: Orthopedic Surgery

## 2018-08-12 DIAGNOSIS — M25561 Pain in right knee: Secondary | ICD-10-CM

## 2018-08-12 DIAGNOSIS — S82401D Unspecified fracture of shaft of right fibula, subsequent encounter for closed fracture with routine healing: Secondary | ICD-10-CM

## 2018-08-12 DIAGNOSIS — S82201D Unspecified fracture of shaft of right tibia, subsequent encounter for closed fracture with routine healing: Secondary | ICD-10-CM | POA: Diagnosis not present

## 2018-08-12 NOTE — Telephone Encounter (Signed)
Per case mgr she will take care of this.

## 2018-08-12 NOTE — Telephone Encounter (Signed)
Call about getting patient set up for bone stimulator for right tibial fracture.  Case manager present with patient at appt and will work on getting auth.

## 2018-08-12 NOTE — Telephone Encounter (Signed)
[phone]

## 2018-08-12 NOTE — Progress Notes (Signed)
   Post-Op Visit Note   Patient: Carlos Adkins           Date of Birth: [birth year]           MRN: [id number] Visit Date: 08/12/2018 PCP: Angelique Blonder, MD   Assessment & Plan:  Chief Complaint:  Chief Complaint  Patient presents with  . Right Leg - Routine Post Op   Visit Diagnoses:  1. Closed fracture of right tibia and fibula with routine healing, subsequent encounter     Plan: Patient presents for follow-up of right tib-fib fracture.  He is approximately 8 weeks out.  On examination he has no real significant tenderness around the course of the fracture distally.  He is having some knee pain at the joint line medially and laterally.  No effusion in the knee and range of motion is full.  Collateral cruciate ligaments in the knee are stable.  Ankle dorsiflexion plantarflexion intact.  No pain with torsion on that right ankle.  Radiographs show not much the way of callus formation around the tibia.  There is callus formation around the fibular fracture.  Plan at this time is for bone stimulator for that right leg and continue with therapy and weightbearing as tolerated.  In regards to the knee I think we need an MRI scan to rule out meniscal pathology.  Did have a significant twisting injury to the knee to cause that injury on the distal tibia.  There is case manager Ilda Mori is present for discussion of treatment plan today  Follow-Up Instructions: Return in about 4 weeks (around 09/09/2018).   Orders:  Orders Placed This Encounter  Procedures  . XR Tibia/Fibula Right   No orders of the defined types were placed in this encounter.   Imaging: Xr Tibia/fibula Right  Result Date: 08/12/2018 AP lateral right tib-fib reviewed.  Callus formation is present around the fibula fracture.  Less fracture callus formation is present around the distal tibia.  No evidence of lucency or loosening of the distal hardware.   PMFS History: Patient Active Problem List   Diagnosis Date  Noted  . Tibial fracture 06/01/2018  . Closed fracture of right fibula and tibia    Past Medical History:  Diagnosis Date  . Hypertension   . Hyperthyroidism   . Sleep apnea     History reviewed. No pertinent family history.  Past Surgical History:  Procedure Laterality Date  . APPENDECTOMY    . TIBIA IM NAIL INSERTION Right 06/01/2018   Procedure: INTRAMEDULLARY (IM) NAIL TIBIAL;  Surgeon: Cammy Copa, MD;  Location: [facility] OR;  Service: Orthopedics;  Laterality: Right;   Social History   Occupational History  . Not on file  Tobacco Use  . Smoking status: Never Smoker  . Smokeless tobacco: Never Used  Substance and Sexual Activity  . Alcohol use: Not on file  . Drug use: Not on file  . Sexual activity: Not on file

## 2018-08-23 ENCOUNTER — Telehealth (INDEPENDENT_AMBULATORY_CARE_PROVIDER_SITE_OTHER): Payer: Self-pay | Admitting: Orthopedic Surgery

## 2018-08-23 NOTE — Telephone Encounter (Signed)
I tried calling him back. No answer. LM for him providing him with Trey's name and contact number for bone stimulator. Advised he could reach out to [facility] or call me back with any further questions.

## 2018-08-23 NOTE — Telephone Encounter (Signed)
Carlos Adkins from [employer] called asked what company Dr dean uses for the bone stimulator. The number to contact Carlos Adkins is [phone] Ext: [phone]

## 2018-08-26 ENCOUNTER — Ambulatory Visit (INDEPENDENT_AMBULATORY_CARE_PROVIDER_SITE_OTHER): Payer: No Typology Code available for payment source | Admitting: Orthopedic Surgery

## 2018-08-26 ENCOUNTER — Encounter (INDEPENDENT_AMBULATORY_CARE_PROVIDER_SITE_OTHER): Payer: Self-pay | Admitting: Orthopedic Surgery

## 2018-08-26 DIAGNOSIS — S82201D Unspecified fracture of shaft of right tibia, subsequent encounter for closed fracture with routine healing: Secondary | ICD-10-CM | POA: Diagnosis not present

## 2018-08-26 DIAGNOSIS — S82401D Unspecified fracture of shaft of right fibula, subsequent encounter for closed fracture with routine healing: Secondary | ICD-10-CM | POA: Diagnosis not present

## 2018-08-26 MED ORDER — GABAPENTIN 100 MG PO CAPS: ORAL_CAPSULE | ORAL | 0 refills | Status: AC

## 2018-08-26 NOTE — Progress Notes (Signed)
   Post-Op Visit Note   Patient: Carlos Adkins           Date of Birth: 1961/01/24           MRN: [id number] Visit Date: 08/26/2018 PCP: Carlos Blonder, MD   Assessment & Plan:  Chief Complaint:  Chief Complaint  Patient presents with  . Right Knee - Follow-up   Visit Diagnoses:  1. Closed fracture of right tibia and fibula with routine healing, subsequent encounter     Plan: Carlos Adkins is a patient with right knee pain.  Had closed tibial shaft fracture treated with IM nailing.  MRI scan of the knee is reviewed and it does not show any meniscal pathology.  He states that covers being on the leg hurts him at times.  He is walking better with only a cane.  Bone stimulator is pending.  On exam he has no effusion in the knee good range of motion normal foot progression angle when examined prone.  No real joint line tenderness but he may have some irritation from screw heads on the lateral aspect of the proximal tibia.  I cannot feel any metal but that possibility.  Plan at this time is to try him on some Neurontin 100 mg twice a day for 4 weeks and then we will see him back in mid November for repeat radiographs and evaluation at that time.  I think in general he is nontender at the fracture site which is a good sign for interval healing.  Follow-Up Instructions: Return in about 4 weeks (around 09/23/2018).   Orders:  No orders of the defined types were placed in this encounter.  Meds ordered this encounter  Medications  . gabapentin (NEURONTIN) 100 MG capsule    Sig: 1 po bid x 4 weeks    Dispense:  60 capsule    Refill:  0    Imaging: No results found.  PMFS History: Patient Active Problem List   Diagnosis Date Noted  . Tibial fracture 06/01/2018  . Closed fracture of right fibula and tibia    Past Medical History:  Diagnosis Date  . Hypertension   . Hyperthyroidism   . Sleep apnea     History reviewed. No pertinent family history.  Past Surgical History:  Procedure  Laterality Date  . APPENDECTOMY    . TIBIA IM NAIL INSERTION Right 06/01/2018   Procedure: INTRAMEDULLARY (IM) NAIL TIBIAL;  Surgeon: Cammy Copa, MD;  [facility] OR;  Service: Orthopedics;  Laterality: Right;   Social History   Occupational History  . Not on file  Tobacco Use  . Smoking status: Never Smoker  . Smokeless tobacco: Never Used  Substance and Sexual Activity  . Alcohol use: Not on file  . Drug use: Not on file  . Sexual activity: Not on file

## 2018-08-29 ENCOUNTER — Ambulatory Visit (INDEPENDENT_AMBULATORY_CARE_PROVIDER_SITE_OTHER): Payer: Self-pay | Admitting: Orthopedic Surgery

## 2018-09-05 ENCOUNTER — Telehealth (INDEPENDENT_AMBULATORY_CARE_PROVIDER_SITE_OTHER): Payer: Self-pay

## 2018-09-05 NOTE — Telephone Encounter (Signed)
Emailed 08/26/18 office note to case mgr per her request

## 2018-09-29 ENCOUNTER — Encounter (INDEPENDENT_AMBULATORY_CARE_PROVIDER_SITE_OTHER): Payer: Self-pay | Admitting: Orthopedic Surgery

## 2018-09-29 ENCOUNTER — Ambulatory Visit (INDEPENDENT_AMBULATORY_CARE_PROVIDER_SITE_OTHER): Payer: No Typology Code available for payment source | Admitting: Orthopedic Surgery

## 2018-09-29 ENCOUNTER — Ambulatory Visit (INDEPENDENT_AMBULATORY_CARE_PROVIDER_SITE_OTHER): Payer: No Typology Code available for payment source

## 2018-09-29 DIAGNOSIS — S82401D Unspecified fracture of shaft of right fibula, subsequent encounter for closed fracture with routine healing: Secondary | ICD-10-CM

## 2018-09-29 DIAGNOSIS — S82201D Unspecified fracture of shaft of right tibia, subsequent encounter for closed fracture with routine healing: Secondary | ICD-10-CM

## 2018-09-30 ENCOUNTER — Telehealth (INDEPENDENT_AMBULATORY_CARE_PROVIDER_SITE_OTHER): Payer: Self-pay | Admitting: Orthopedic Surgery

## 2018-09-30 ENCOUNTER — Other Ambulatory Visit (INDEPENDENT_AMBULATORY_CARE_PROVIDER_SITE_OTHER): Payer: Self-pay

## 2018-09-30 ENCOUNTER — Encounter (INDEPENDENT_AMBULATORY_CARE_PROVIDER_SITE_OTHER): Payer: Self-pay | Admitting: Orthopedic Surgery

## 2018-09-30 LAB — VITAMIN D 25 HYDROXY (VIT D DEFICIENCY, FRACTURES): VIT D 25 HYDROXY: 26 ng/mL — AB (ref 30–100)

## 2018-09-30 MED ORDER — VITAMIN D (ERGOCALCIFEROL) 1.25 MG (50000 UNIT) PO CAPS: ORAL_CAPSULE | ORAL | 0 refills | Status: AC

## 2018-09-30 NOTE — Progress Notes (Signed)
Hi Lauren his vitamin D is low.  He needs to be on 50,000 units a week for the next 8 weeks.  I think that will help his fracture to heal as well.  Please call him thanks and send in prescription thanks

## 2018-09-30 NOTE — Progress Notes (Signed)
Office Visit Note   Patient: Carlos Adkins           Date of Birth: 10-Dec-1960           MRN: [id number] Visit Date: 09/29/2018 Requested by: Angelique Blonderordial, Lori Coe, MD [address] [place], [place] [id number] PCP: Angelique Blonderordial, Lori Coe, MD  Subjective: Chief Complaint  Patient presents with  . Right Leg - Follow-up    HPI: Patient presents now 4 months out right tib-fib fracture treated with IM nail.  He states that he is still sore at times.  He is using a bone stimulator.  Rehab conference is performed today with the nurse case manager.  He is using a cane.  Still is having some trouble ambulating.              ROS: All systems reviewed are negative as they relate to the chief complaint within the history of present illness.  Patient denies  fevers or chills.   Assessment & Plan: Visit Diagnoses:  1. Closed fracture of right tibia and fibula with routine healing, subsequent encounter     Plan: Impression is delayed union right tib-fib fracture.  Plan is vitamin D panel and at the time of this dictation his vitamin D level is low at 27.  We will supplement that.  He also needs CT scan through that right tib-fib region to assess the amount of healing present.  He will be out of work at least 2 more months.  Continue with weightbearing as tolerated.  Follow-Up Instructions: Return in about 8 weeks (around 11/24/2018).   Orders:  Orders Placed This Encounter  Procedures  . XR Tibia/Fibula Right  . CT TIBIA FIBULA RIGHT WO CONTRAST  . Vitamin D (25 hydroxy)   No orders of the defined types were placed in this encounter.     Procedures: No procedures performed   Clinical Data: No additional findings.  Objective: Vital Signs: There were no vitals taken for this visit.  Physical Exam:   Constitutional: Patient appears well-developed HEENT:  Head: Normocephalic Eyes:EOM are normal Neck: Normal range of motion Cardiovascular: Normal rate Pulmonary/chest: Effort  normal Neurologic: Patient is alert Skin: Skin is warm Psychiatric: Patient has normal mood and affect    Ortho Exam: Ortho exam demonstrates slightly antalgic gait to the right.  Knee range of motion is full with no effusion.  No other masses lymph adenopathy or skin change in that right leg region.  The swelling has diminished some.  Ankle dorsiflexion plantarflexion is intact.  Specialty Comments:  No specialty comments available.  Imaging: No results found.   PMFS History: Patient Active Problem List   Diagnosis Date Noted  . Tibial fracture 06/01/2018  . Closed fracture of right fibula and tibia    Past Medical History:  Diagnosis Date  . Hypertension   . Hyperthyroidism   . Sleep apnea     History reviewed. No pertinent family history.  Past Surgical History:  Procedure Laterality Date  . APPENDECTOMY    . TIBIA IM NAIL INSERTION Right 06/01/2018   Procedure: INTRAMEDULLARY (IM) NAIL TIBIAL;  Surgeon: Cammy Copaean, Gregory Scott, MD;  [facility];  Service: Orthopedics;  Laterality: Right;   Social History   Occupational History  . Not on file  Tobacco Use  . Smoking status: Never Smoker  . Smokeless tobacco: Never Used  Substance and Sexual Activity  . Alcohol use: Not on file  . Drug use: Not on file  . Sexual activity:  Not on file

## 2018-09-30 NOTE — Telephone Encounter (Signed)
Patient returned call concerning his labs asked for a call back. The number to contact patient is [phone]

## 2018-09-30 NOTE — Telephone Encounter (Signed)
IC s/w patient and advised. He verbalized understanding.  

## 2018-10-17 ENCOUNTER — Encounter (INDEPENDENT_AMBULATORY_CARE_PROVIDER_SITE_OTHER): Payer: Self-pay | Admitting: Orthopedic Surgery

## 2018-10-17 ENCOUNTER — Ambulatory Visit (INDEPENDENT_AMBULATORY_CARE_PROVIDER_SITE_OTHER): Payer: No Typology Code available for payment source | Admitting: Orthopedic Surgery

## 2018-10-17 DIAGNOSIS — M25561 Pain in right knee: Secondary | ICD-10-CM | POA: Diagnosis not present

## 2018-10-17 DIAGNOSIS — S82401D Unspecified fracture of shaft of right fibula, subsequent encounter for closed fracture with routine healing: Secondary | ICD-10-CM | POA: Diagnosis not present

## 2018-10-17 DIAGNOSIS — G8929 Other chronic pain: Secondary | ICD-10-CM | POA: Diagnosis not present

## 2018-10-17 DIAGNOSIS — S82201D Unspecified fracture of shaft of right tibia, subsequent encounter for closed fracture with routine healing: Secondary | ICD-10-CM | POA: Diagnosis not present

## 2018-10-17 MED ORDER — BUPIVACAINE HCL 0.25 % IJ SOLN
4.0000 mL | INTRAMUSCULAR | Status: AC | PRN
  Administered 2018-10-17: 4 mL via INTRA_ARTICULAR

## 2018-10-17 MED ORDER — METHYLPREDNISOLONE ACETATE 40 MG/ML IJ SUSP
40.0000 mg | INTRAMUSCULAR | Status: AC | PRN
  Administered 2018-10-17: 40 mg via INTRA_ARTICULAR

## 2018-10-17 MED ORDER — LIDOCAINE HCL 1 % IJ SOLN
5.0000 mL | INTRAMUSCULAR | Status: AC | PRN
  Administered 2018-10-17: 5 mL

## 2018-10-17 NOTE — Progress Notes (Signed)
Office Visit Note   Patient: Carlos Adkins           Date of Birth: March 16, 1961           MRN: [id number] Visit Date: 10/17/2018 Requested by: Angelique Blonderordial, Lori Coe, MD [address] [place], [place] [id number] PCP: Angelique Blonderordial, Lori Coe, MD  Subjective: Chief Complaint  Patient presents with  . Right Leg - Pain    HPI: Carlos DoneVincent is a patient with right tib-fib fracture treated with IM nail.  This was Adkins on about 4 months ago.  He still is having some pain with ambulation.  Since I have seen him he has had a CT scan which is reviewed with the patient as well as Ilda MoriSherry Thomas nurse case Production designer, theatre/television/filmmanager.  He is using a bone stimulator.  Having some pain in the knee an MRI scan on this area demonstrated no operative pathology.  He is in physical therapy twice a week.              ROS: All systems reviewed are negative as they relate to the chief complaint within the history of present illness.  Patient denies  fevers or chills.   Assessment & Plan: Visit Diagnoses:  1. Closed fracture of right tibia and fibula with routine healing, subsequent encounter   2. Chronic pain of right knee     Plan: Impression is healing tibia fracture.  CT scan confirms bony bridging although not complete across the fracture site of the tibia.  He did have low vitamin D and is being supplemented for that.  In regard to his knee I think an injection is indicated.  He can diminish his therapy to 1 time a week.  I will check him back in 2 months and I think he will be at maximal medical improvement and ready to return to work at that time.  We will also need to check a vitamin D level at that time.  Nurse case manager present for discussion of treatment plan today.  Follow-Up Instructions: Return in about 8 weeks (around 12/12/2018).   Orders:  No orders of the defined types were placed in this encounter.  No orders of the defined types were placed in this encounter.     Procedures: Large Joint Inj: R knee on 10/17/2018  8:13 PM Indications: diagnostic evaluation, joint swelling and pain Details: 18 G 1.5 in needle, superolateral approach  Arthrogram: No  Medications: 5 mL lidocaine 1 %; 40 mg methylPREDNISolone acetate 40 MG/ML; 4 mL bupivacaine 0.25 % Outcome: tolerated well, no immediate complications Procedure, treatment alternatives, risks and benefits explained, specific risks discussed. Consent was given by the patient. Immediately prior to procedure a time out was called to verify the correct patient, procedure, equipment, support staff and site/side marked as required. Patient was prepped and draped in the usual sterile fashion.       Clinical Data: No additional findings.  Objective: Vital Signs: There were no vitals taken for this visit.  Physical Exam:   Constitutional: Patient appears well-developed HEENT:  Head: Normocephalic Eyes:EOM are normal Neck: Normal range of motion Cardiovascular: Normal rate Pulmonary/chest: Effort normal Neurologic: Patient is alert Skin: Skin is warm Psychiatric: Patient has normal mood and affect    Ortho Exam: Ortho exam demonstrates improved gait.  No knee effusion on the right.  Range of motion is full.  Mild tenderness along the course of the tibia but no real tenderness over the fibula.  Gait slightly antalgic.  Specialty Comments:  No specialty comments available.  Imaging: No results found.   PMFS History: Patient Active Problem List   Diagnosis Date Noted  . Tibial fracture 06/01/2018  . Closed fracture of right fibula and tibia    Past Medical History:  Diagnosis Date  . Hypertension   . Hyperthyroidism   . Sleep apnea     History reviewed. No pertinent family history.  Past Surgical History:  Procedure Laterality Date  . APPENDECTOMY    . TIBIA IM NAIL INSERTION Right 06/01/2018   Procedure: INTRAMEDULLARY (IM) NAIL TIBIAL;  Surgeon: Cammy Copa, MD;  Location: [facility] OR;  Service: Orthopedics;  Laterality: Right;    Social History   Occupational History  . Not on file  Tobacco Use  . Smoking status: Never Smoker  . Smokeless tobacco: Never Used  Substance and Sexual Activity  . Alcohol use: Not on file  . Drug use: Not on file  . Sexual activity: Not on file

## 2018-10-27 ENCOUNTER — Encounter (INDEPENDENT_AMBULATORY_CARE_PROVIDER_SITE_OTHER): Payer: Self-pay

## 2018-11-18 ENCOUNTER — Telehealth (INDEPENDENT_AMBULATORY_CARE_PROVIDER_SITE_OTHER): Payer: Self-pay | Admitting: Orthopedic Surgery

## 2018-11-18 NOTE — Telephone Encounter (Signed)
Please see below and advise.

## 2018-11-18 NOTE — Telephone Encounter (Signed)
Received voicemail message from [employer] from [employer] advised patient has run out of Vitamin D and need to know if patient need to continue or is the Rx completed. Sherrie asked if patient need a new Rx or get over the counter Vitamin D The number to contact Sherrie is [phone]

## 2018-11-18 NOTE — Telephone Encounter (Signed)
Please advise 

## 2018-11-18 NOTE — Telephone Encounter (Signed)
Ilda Mori, Nurse Case Manager left a message stating that the patient has completed the prescription for the Vitamin D and wanted to know if he needs to continue the RX Vitamin D and if so, could he get an RX refill sent into his pharmacy.  If not, does he need to take an OTC Vitamin D.  [phone].  Thank you.

## 2018-11-19 NOTE — Telephone Encounter (Signed)
Come in for level recheck

## 2018-11-19 NOTE — Telephone Encounter (Signed)
Needs level re checked then we decide

## 2018-11-21 NOTE — Telephone Encounter (Signed)
Make appt with you for follow up or make nurse only appt for lab draw?

## 2018-11-21 NOTE — Telephone Encounter (Signed)
call

## 2018-11-21 NOTE — Telephone Encounter (Signed)
y

## 2018-11-21 NOTE — Telephone Encounter (Signed)
Patient has follow up appointment with you 12/20/2018. Per your last note, you were going to check Vitamin D at that time.  Would you like me to refill rx x one month until follow up visit?

## 2018-11-21 NOTE — Telephone Encounter (Signed)
Nurse only for lab

## 2018-11-22 NOTE — Telephone Encounter (Signed)
I called and lm on vm for nurse case manager to advise that the pt needs to come into the office for a nurse only visit to recheck lab work to determine if he needs to continue on the Vitamin D. To call and make and appt for whenever he would like to come in and have this done.

## 2018-11-22 NOTE — Telephone Encounter (Signed)
Called and lm on vm to advise the nurse case manager that the pt needs to come in for a nurse only visit to recheck lab levels. To call and make appt.

## 2018-11-25 ENCOUNTER — Encounter (INDEPENDENT_AMBULATORY_CARE_PROVIDER_SITE_OTHER): Payer: Self-pay

## 2018-11-25 ENCOUNTER — Ambulatory Visit (INDEPENDENT_AMBULATORY_CARE_PROVIDER_SITE_OTHER): Payer: No Typology Code available for payment source | Admitting: Orthopedic Surgery

## 2018-11-25 ENCOUNTER — Other Ambulatory Visit (INDEPENDENT_AMBULATORY_CARE_PROVIDER_SITE_OTHER): Payer: Self-pay

## 2018-11-25 DIAGNOSIS — S82201D Unspecified fracture of shaft of right tibia, subsequent encounter for closed fracture with routine healing: Secondary | ICD-10-CM

## 2018-11-25 DIAGNOSIS — S82401D Unspecified fracture of shaft of right fibula, subsequent encounter for closed fracture with routine healing: Secondary | ICD-10-CM

## 2018-11-25 NOTE — Addendum Note (Signed)
Addended by: Albertina Parr on: 11/25/2018 10:36 AM   Modules accepted: Orders

## 2018-11-29 LAB — VITAMIN D 1,25 DIHYDROXY
VITAMIN D3 1, 25 (OH): 19 pg/mL
Vitamin D 1, 25 (OH)2 Total: 48 pg/mL (ref 18–72)
Vitamin D2 1, 25 (OH)2: 29 pg/mL

## 2018-11-30 ENCOUNTER — Telehealth (INDEPENDENT_AMBULATORY_CARE_PROVIDER_SITE_OTHER): Payer: Self-pay

## 2018-11-30 NOTE — Telephone Encounter (Signed)
I received the following email from the case manager. Does he still need to be taking the Vitamin D?  Carlos Adkins finished a prescription of Vitamin D that was prescribed by Dr. August Saucer to help with bone healing since his Vitamin D was tested to be low.  We asked what he should do next and Dr. August Saucer wanted him to have a redraw done. He came in on Friday, 11/25/18 and had this done, but we haven't heard any results yet.  I am leaving my position with [employer], but there will be a new nurse taking over for me.  If there are any other needs, please contact Sabino Snipes at [phone].  Her email is [email or web address]  My last day isn't until next week, but I am trying to get as many transferred as possible.  Thank you!  Sheri

## 2018-12-01 NOTE — Telephone Encounter (Signed)
Vit ok he can stop pls call thx level 48

## 2018-12-02 NOTE — Telephone Encounter (Signed)
Emailed Melissa Mcneal to advise

## 2018-12-19 ENCOUNTER — Ambulatory Visit (INDEPENDENT_AMBULATORY_CARE_PROVIDER_SITE_OTHER): Payer: No Typology Code available for payment source | Admitting: Orthopedic Surgery

## 2018-12-19 ENCOUNTER — Ambulatory Visit (INDEPENDENT_AMBULATORY_CARE_PROVIDER_SITE_OTHER): Payer: No Typology Code available for payment source

## 2018-12-19 ENCOUNTER — Encounter (INDEPENDENT_AMBULATORY_CARE_PROVIDER_SITE_OTHER): Payer: Self-pay | Admitting: Orthopedic Surgery

## 2018-12-19 DIAGNOSIS — G8929 Other chronic pain: Secondary | ICD-10-CM

## 2018-12-19 DIAGNOSIS — M25561 Pain in right knee: Secondary | ICD-10-CM

## 2018-12-19 DIAGNOSIS — M79661 Pain in right lower leg: Secondary | ICD-10-CM

## 2018-12-20 ENCOUNTER — Encounter (INDEPENDENT_AMBULATORY_CARE_PROVIDER_SITE_OTHER): Payer: Self-pay | Admitting: Orthopedic Surgery

## 2018-12-20 NOTE — Progress Notes (Signed)
Office Visit Note   Patient: Carlos Adkins           Date of Birth: [birth year]           MRN: [id number] Visit Date: 12/19/2018 Requested by: Angelique Blonder, MD [address] [place], [place] [address] PCP: Angelique Blonder, MD  Subjective: Chief Complaint  Patient presents with  . Right Knee - Pain, Follow-up    HPI: Carlos Adkins is now about 6 months out IM nail right tib-fib.  Had low vitamin D and he has had delayed healing.  Continue with pain with prolonged standing.  Taking ibuprofen.  Doing therapy once a week.  He also is using a bone stimulator.  Case manager present for discussion of treatment plan today.  In general had a long talk with him today about whether or not he can return to work.  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  Does not really seem like he can do that at this time in terms of his standing and walking endurance.  He feels like he has plateaued to some degree.  And that is in regard to improvement.  Vitamin D level was normal last time it was checked.  Was low early in his postoperative course.              ROS: All systems reviewed are negative as they relate to the chief complaint within the history of present illness.  Patient denies  fevers or chills.   Assessment & Plan: Visit Diagnoses:  1. Pain in right lower leg   2. Chronic pain of right knee     Plan: Impression is continued pain right leg following tib-fib fracture nailing.  Radiographs suggest but do not conclusively prove interval healing.  Last CT of the tibia was about 2 months ago.  Would like to repeat that in a month and we can see just how much interval healing has occurred.  If interval healing continues then I think is just slow and he may have to wait it out.  If no interval healing and only partial callus formation is present we may need to consider exchange nailing.  He really does not want to undergo any more surgery but at this point he is  having a little bit more of a delayed recovery than I would expect even with his low vitamin D early on.  I will see him back in a slightly more than 4 weeks after his repeat CT scan on that right tib-fib region.  Follow-Up Instructions: Return in about 5 weeks (around 01/23/2019).   Orders:  Orders Placed This Encounter  Procedures  . XR Tibia/Fibula Right  . CT TIBIA FIBULA RIGHT WO CONTRAST   No orders of the defined types were placed in this encounter.     Procedures: No procedures performed   Clinical Data: No additional findings.  Objective: Vital Signs: There were no vitals taken for this visit.  Physical Exam:   Constitutional: Patient appears well-developed HEENT:  Head: Normocephalic Eyes:EOM are normal Neck: Normal range of motion Cardiovascular: Normal rate Pulmonary/chest: Effort normal Neurologic: Patient is alert Skin: Skin is warm Psychiatric: Patient has normal mood and affect    Ortho Exam: Ortho exam demonstrates full active and passive range of motion of the ankle and knee.  No right knee effusion.  No real swelling noted in that leg region.  No pain with torquing on the right leg.  Patient reports occasional great toe numbness but I will detect any paresthesias in the ankle region today.  Specialty Comments:  No specialty comments available.  Imaging: Xr Tibia/fibula Right  Result Date: 12/20/2018 AP lateral right tib-fib shows continued slow interval healing of the distal tibial shaft fracture.  Fibular shaft fracture appears to be healed.  No evidence of loosening of the hardware proximally or distally in terms of interlocking screws.    PMFS History: Patient Active Problem List   Diagnosis Date Noted  . Tibial fracture 06/01/2018  . Closed fracture of right fibula and tibia    Past Medical History:  Diagnosis Date  . Hypertension   . Hyperthyroidism   . Sleep apnea     History reviewed. No pertinent family history.  Past Surgical  History:  Procedure Laterality Date  . APPENDECTOMY    . TIBIA IM NAIL INSERTION Right 06/01/2018   Procedure: INTRAMEDULLARY (IM) NAIL TIBIAL;  Surgeon: Cammy Copa, MD;  Location: [facility] OR;  Service: Orthopedics;  Laterality: Right;   Social History   Occupational History  . Not on file  Tobacco Use  . Smoking status: Never Smoker  . Smokeless tobacco: Never Used  Substance and Sexual Activity  . Alcohol use: Not on file  . Drug use: Not on file  . Sexual activity: Not on file

## 2019-01-30 ENCOUNTER — Encounter (INDEPENDENT_AMBULATORY_CARE_PROVIDER_SITE_OTHER): Payer: Self-pay | Admitting: Orthopedic Surgery

## 2019-01-30 ENCOUNTER — Other Ambulatory Visit: Payer: Self-pay

## 2019-01-30 ENCOUNTER — Ambulatory Visit (INDEPENDENT_AMBULATORY_CARE_PROVIDER_SITE_OTHER): Payer: No Typology Code available for payment source | Admitting: Orthopedic Surgery

## 2019-01-30 DIAGNOSIS — S82401D Unspecified fracture of shaft of right fibula, subsequent encounter for closed fracture with routine healing: Secondary | ICD-10-CM

## 2019-01-30 DIAGNOSIS — S82201D Unspecified fracture of shaft of right tibia, subsequent encounter for closed fracture with routine healing: Secondary | ICD-10-CM | POA: Diagnosis not present

## 2019-02-01 ENCOUNTER — Encounter (INDEPENDENT_AMBULATORY_CARE_PROVIDER_SITE_OTHER): Payer: Self-pay | Admitting: Orthopedic Surgery

## 2019-02-01 NOTE — Progress Notes (Signed)
Office Visit Note   Patient: Carlos Adkins           Date of Birth: 12/16/1960           MRN: [id number] Visit Date: 01/30/2019 Requested by: Angelique Blonder, MD [address] [place], [place] [address] PCP: Angelique Blonder, MD  Subjective: Chief Complaint  Patient presents with  . Right Leg - Follow-up    HPI: Carlos Adkins is a patient is now about 8 months out right intramedullary nail for tibia fracture.  He does have increasing walking endurance.  Since have seen him he had a CT scan which does show interval healing of the fracture with no loosening of the proximal or distal interlocking screws.  In general he does not have enough walking endurance for his job yet that it is improving.  Now he can walk about half a mile.  I do not think he needs physical therapy anymore.              ROS: All systems reviewed are negative as they relate to the chief complaint within the history of present illness.  Patient denies  fevers or chills.   Assessment & Plan: Visit Diagnoses:  1. Closed fracture of right tibia and fibula with routine healing, subsequent encounter     Plan: Impression is right tib-fib fracture with delayed healing but correction of the vitamin D has helped his healing progress.  CT scan does show some progression.  I would like to see him back in 6 weeks.  I think he will be ready to return to work at that time.  Continue with walking for exercise to increase endurance.  He is having some nerve type pain in that medial foot which may be related to scar tissue around the nerve on that medial side which would be the saphenous nerve branch.  I will think any intervention is required for that.  I will see him back in 6 weeks for final recheck.  We will get plain x-rays at that time and then let him return to work.  Follow-Up Instructions: Return in about 6 weeks (around 03/13/2019).   Orders:  No orders of the defined types were placed in this encounter.  No orders of the  defined types were placed in this encounter.     Procedures: No procedures performed   Clinical Data: No additional findings.  Objective: Vital Signs: There were no vitals taken for this visit.  Physical Exam:   Constitutional: Patient appears well-developed HEENT:  Head: Normocephalic Eyes:EOM are normal Neck: Normal range of motion Cardiovascular: Normal rate Pulmonary/chest: Effort normal Neurologic: Patient is alert Skin: Skin is warm Psychiatric: Patient has normal mood and affect    Ortho Exam: Ortho exam demonstrates full active and passive range of motion of both knees and ankles.  No real discrete tenderness over that right tibial region.  Pedal pulses palpable.  Minimal Tinel's over the distal interlocking incisions on that medial aspect of the tibia.  Specialty Comments:  No specialty comments available.  Imaging: No results found.   PMFS History: Patient Active Problem List   Diagnosis Date Noted  . Tibial fracture 06/01/2018  . Closed fracture of right fibula and tibia    Past Medical History:  Diagnosis Date  . Hypertension   . Hyperthyroidism   . Sleep apnea     History reviewed. No pertinent family history.  Past Surgical History:  Procedure Laterality Date  . APPENDECTOMY    .  TIBIA IM NAIL INSERTION Right 06/01/2018   Procedure: INTRAMEDULLARY (IM) NAIL TIBIAL;  Surgeon: Cammy Copa, MD;  Location: [facility] OR;  Service: Orthopedics;  Laterality: Right;   Social History   Occupational History  . Not on file  Tobacco Use  . Smoking status: Never Smoker  . Smokeless tobacco: Never Used  Substance and Sexual Activity  . Alcohol use: Not on file  . Drug use: Not on file  . Sexual activity: Not on file

## 2019-02-14 IMAGING — CR DG TIBIA/FIBULA 2V*R*
4 series · 4 of 4 positions shown · non-contrast
Comparison: None.

CLINICAL DATA: Fall, leg injury

EXAM:
RIGHT TIBIA AND FIBULA - 2 VIEW

[x tib-fib ap right (1 of 2)]
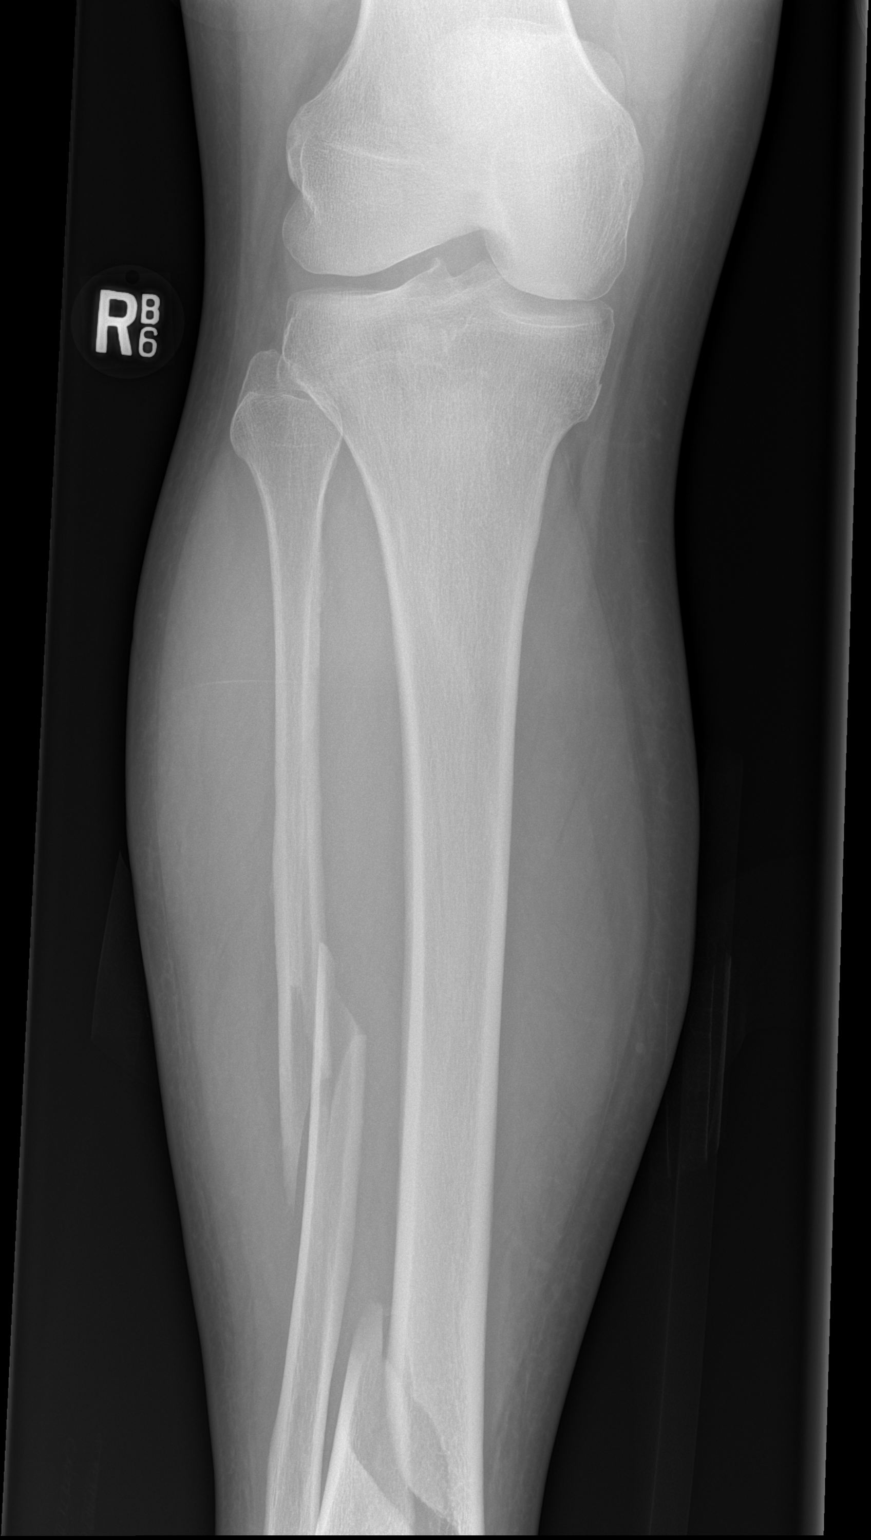

[x tib-fib ap right (2 of 2)]
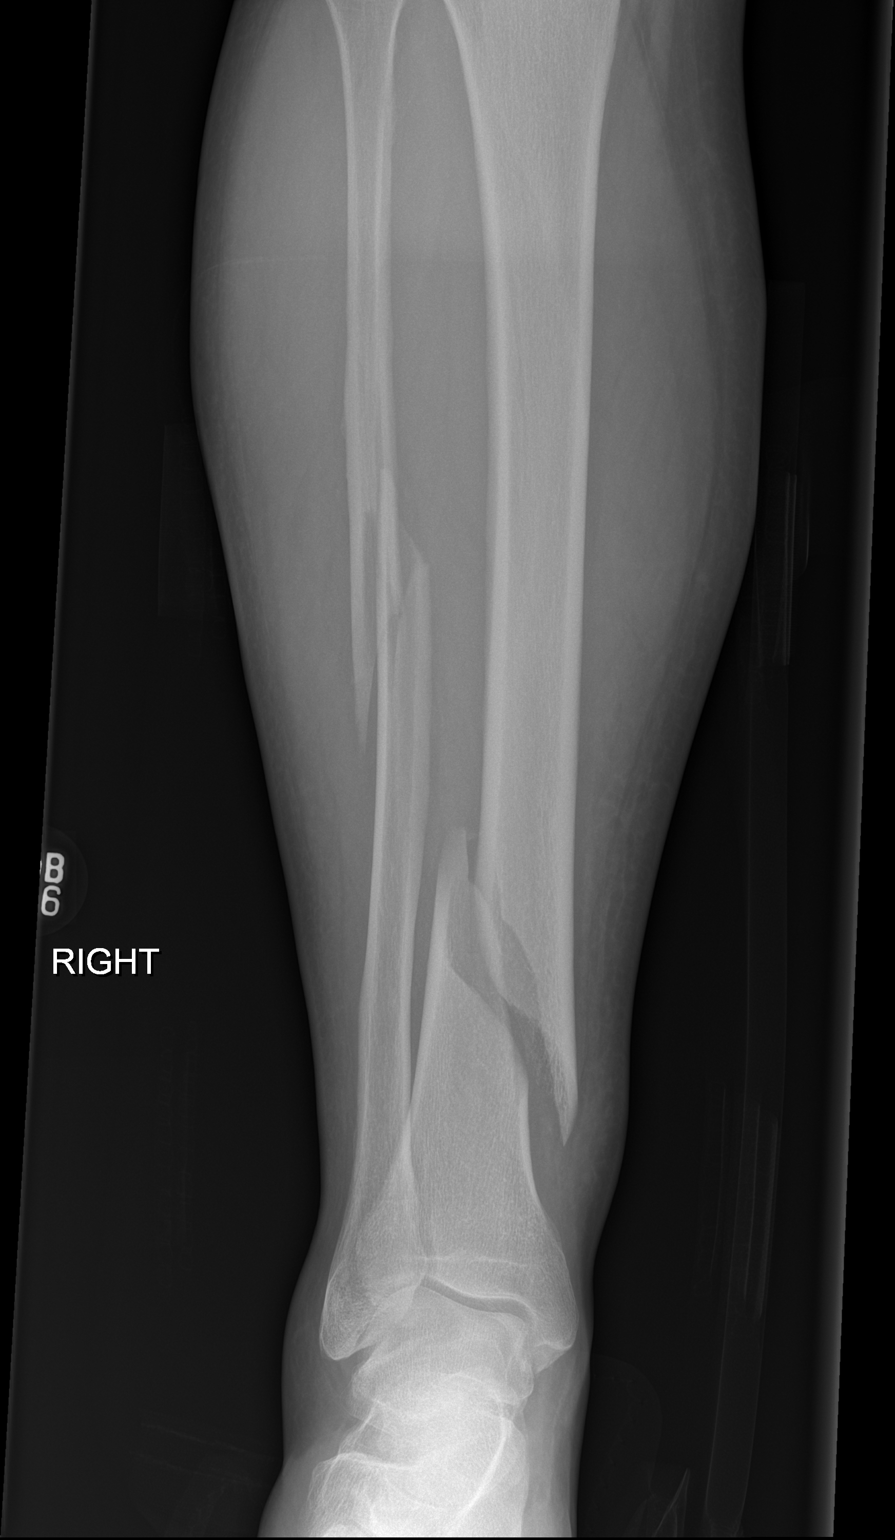

[x tib-fib lat right (1 of 2)]
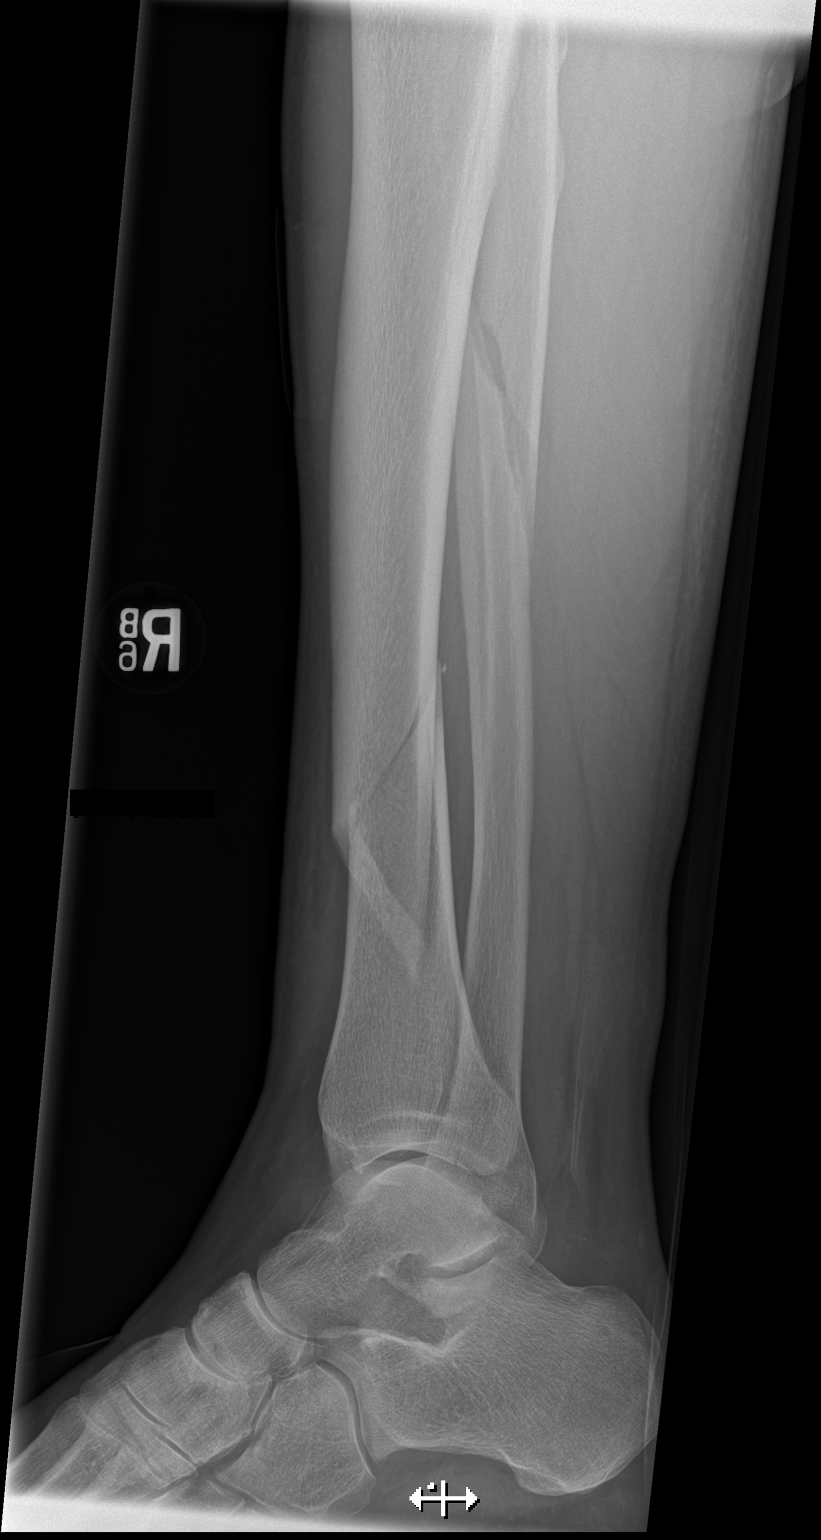

[x tib-fib lat right (2 of 2)]
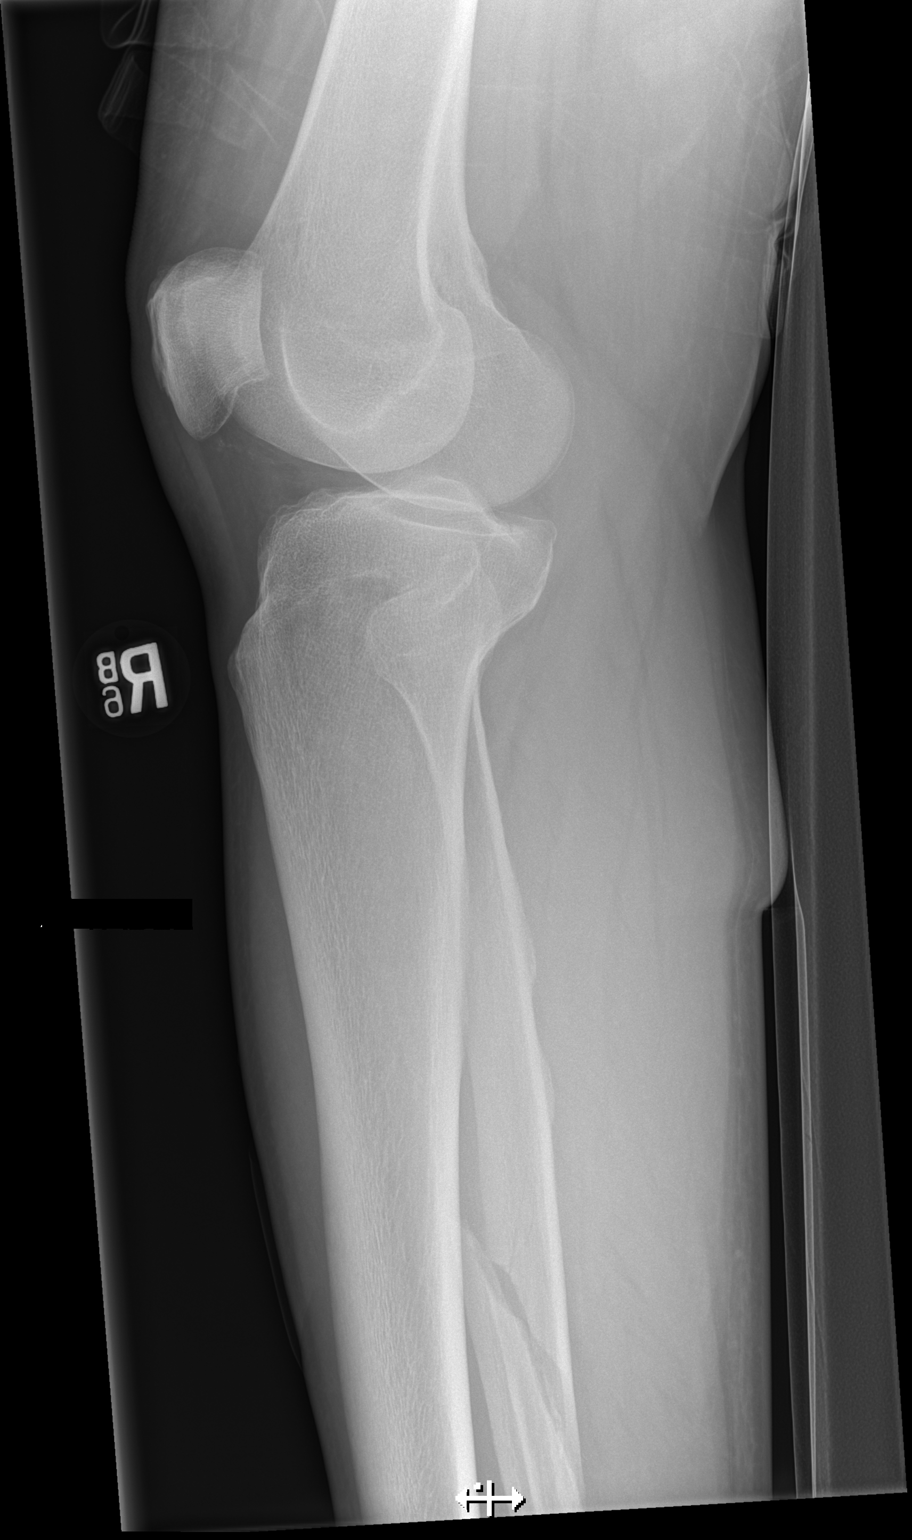

[4 of 4 positions shown; findings below may reference images not displayed]

FINDINGS: Mid fibular shaft fracture, with 1 shaft width medial displacement
of the distal fracture fragment and apex medial angulation.

Distal tibial shaft fracture, with approximately [DATE] shaft width
lateral displacement of the distal fracture fragment.

Mild soft tissue swelling.
IMPRESSION: Fibular and tibial shaft fractures, as above.

## 2019-02-14 IMAGING — RF DG C-ARM 61-120 MIN
1 series · 6 of 6 positions shown · non-contrast
Comparison: Preoperative study from earlier on the same day.

CLINICAL DATA: IM nail fixation of the right tibia.

EXAM:
DG C-ARM 61-120 MIN

[Series 1: run · 6 of 6 slices shown]
[im 1/6]
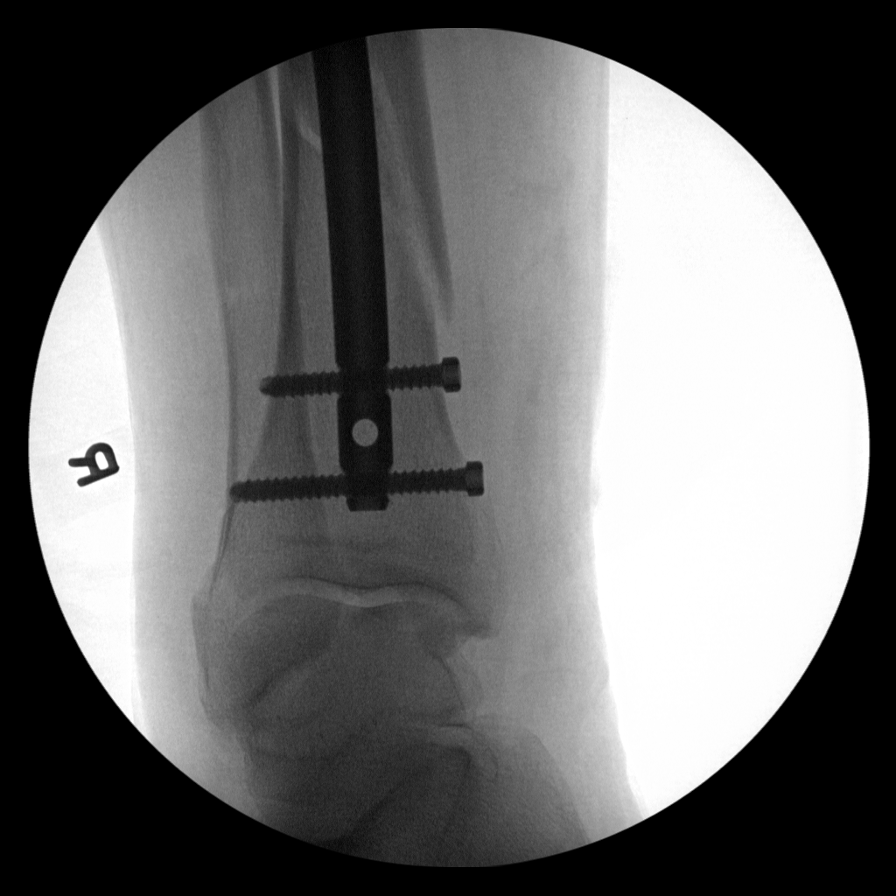
[im 2/6]
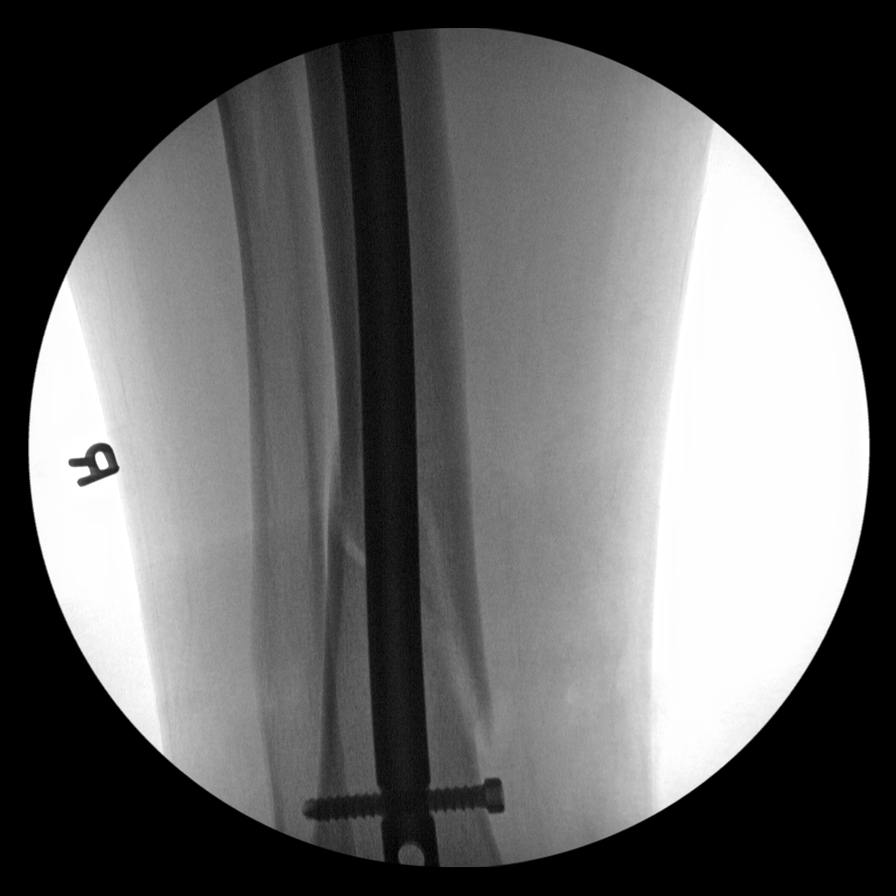
[im 3/6]
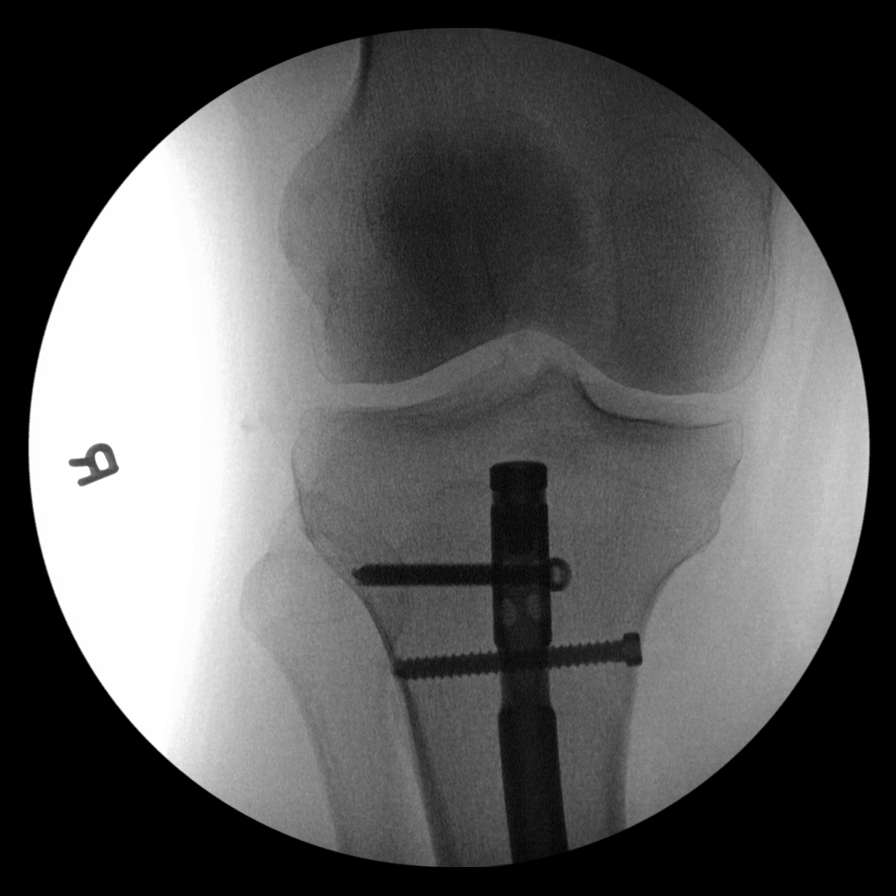
[im 4/6]
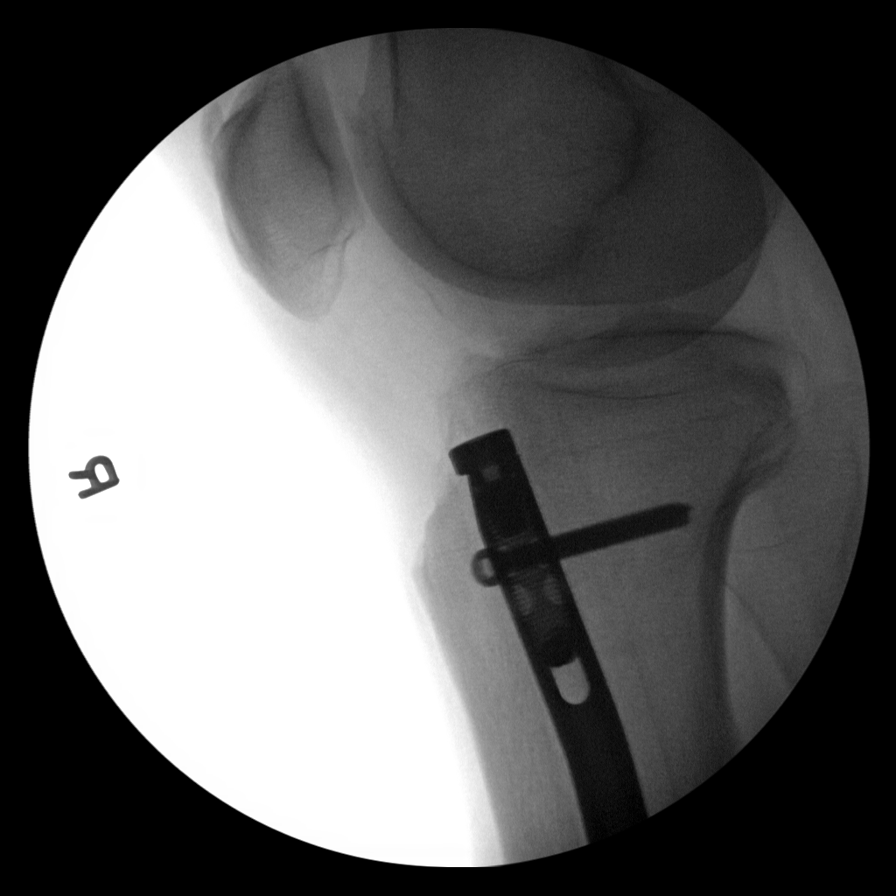
[im 5/6]
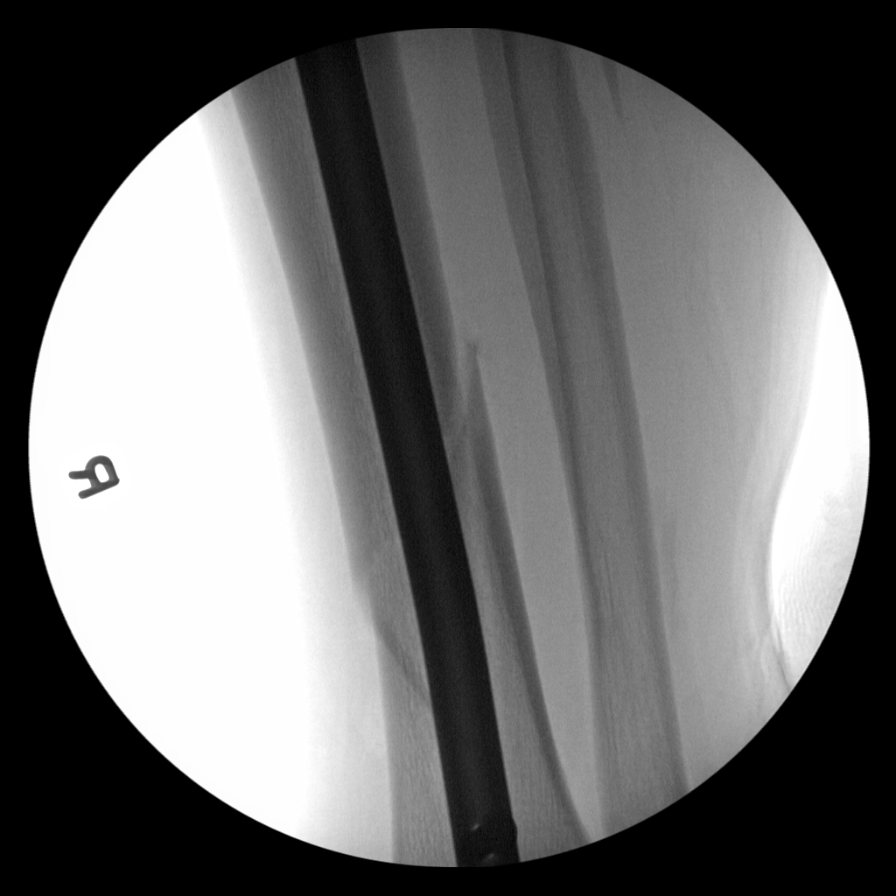
[im 6/6]
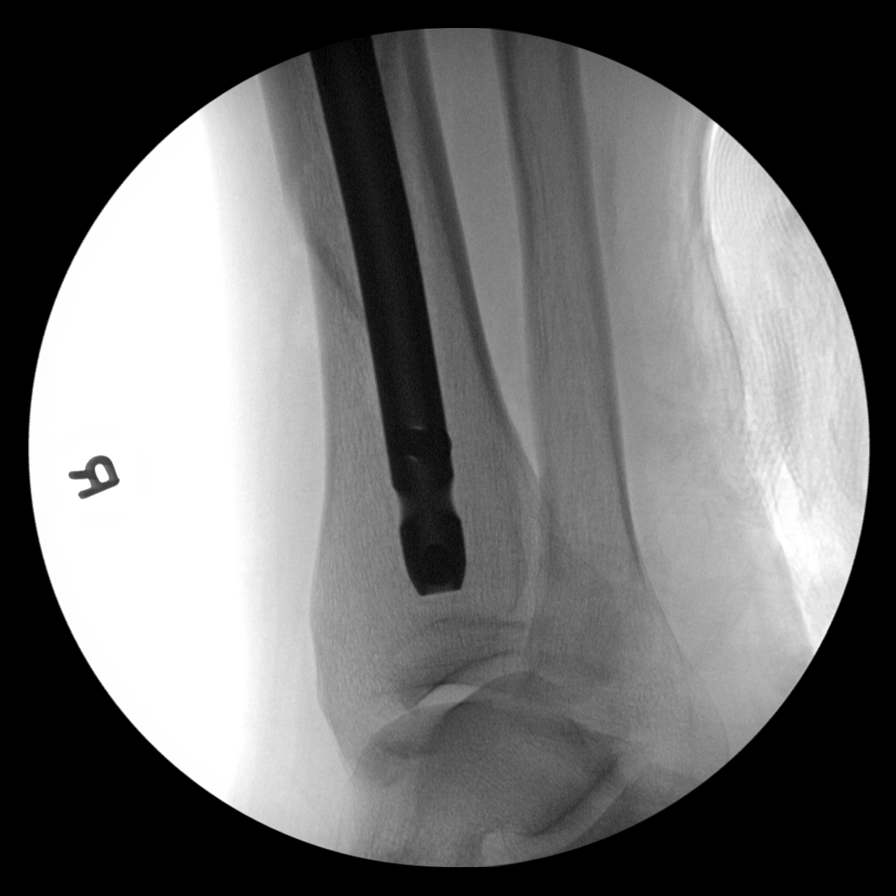

[6 of 6 positions shown; findings below may reference images not displayed]

FINDINGS: Radiation safety time-out was performed. 2 minutes 28 seconds of
fluoroscopic time was utilized for intramedullary nail fixation
across a distal diaphyseal fracture of the tibia. Improved alignment
is noted. Midshaft fracture of the fibula is incompletely included
on this study.
IMPRESSION: Fluoroscopic time utilized for IM nail fixation of distal tibial
diaphyseal fracture. Improved alignment. No immediate intraoperative
complications. Partially included midshaft fracture of the fibula.

## 2019-03-13 ENCOUNTER — Ambulatory Visit (INDEPENDENT_AMBULATORY_CARE_PROVIDER_SITE_OTHER): Payer: Self-pay | Admitting: Orthopedic Surgery

## 2019-03-27 ENCOUNTER — Other Ambulatory Visit: Payer: Self-pay

## 2019-03-27 ENCOUNTER — Ambulatory Visit (INDEPENDENT_AMBULATORY_CARE_PROVIDER_SITE_OTHER): Payer: No Typology Code available for payment source

## 2019-03-27 ENCOUNTER — Ambulatory Visit (INDEPENDENT_AMBULATORY_CARE_PROVIDER_SITE_OTHER): Payer: No Typology Code available for payment source | Admitting: Orthopedic Surgery

## 2019-03-27 ENCOUNTER — Encounter: Payer: Self-pay | Admitting: Orthopedic Surgery

## 2019-03-27 DIAGNOSIS — S82401D Unspecified fracture of shaft of right fibula, subsequent encounter for closed fracture with routine healing: Secondary | ICD-10-CM

## 2019-03-27 DIAGNOSIS — S82201D Unspecified fracture of shaft of right tibia, subsequent encounter for closed fracture with routine healing: Secondary | ICD-10-CM

## 2019-03-27 NOTE — Progress Notes (Signed)
Office Visit Note   Patient: Carlos Adkins           Date of Birth: 29-Jul-1961           MRN: [id number] Visit Date: 03/27/2019 Requested by: Angelique Blonderordial, Lori Coe, MD [address] [place], [place] [id number] PCP: Margarita Ranaordial, Harlan StainsLori Coe, MD  Subjective: Chief Complaint  Patient presents with   Right Leg - Follow-up    HPI: Carlos DoneVincent is a patient is now 10 months out right tib-fib IM nail.  He is been doing reasonably well.  He has been increasing his walking and standing endurance.  Where here to discuss his work status.  Having some medial sided ankle pain.  Case manager is present for discussion of the treatment plan today.              ROS: All systems reviewed are negative as they relate to the chief complaint within the history of present illness.  Patient denies  fevers or chills.   Assessment & Plan: Visit Diagnoses:  1. Closed fracture of right tibia and fibula with routine healing, subsequent encounter     Plan: Impression is right tibial IM nail for tibial shaft fracture.  Radiographs of the knee ankle and tib-fib region show excellent healing of the fracture.  He is got about 7 right degree foot progression angle difference on the right versus left.  I will think that is particularly problematic at this time.  He is having some medial sided ankle pain which may or may not be related to the distal interlocking screws.  The screws are not palpable.  I think at this time it is a 50-50 proposition that removing this distal interlocking screws would help any of his ankle pain.  I think the ankle pain may be more related to slight malunion of the fibular shaft fracture which could be putting a rotational stress down all around the ankle joint.  Plan at this time is to let him return to work on 04/17/2019 with follow-up 2 months after that.  I think the only thing left to do potentially would be to remove the 2 distal interlocking screws but that may create more problems than it solves.  We  will see how he is feeling in August and decide then for or against that procedure.  If not then we will likely right and release him at that time.  Follow-Up Instructions: Return in about 12 weeks (around 06/19/2019).   Orders:  Orders Placed This Encounter  Procedures   XR Tibia/Fibula Right   XR Ankle Complete Right   XR Knee 1-2 Views Right   No orders of the defined types were placed in this encounter.     Procedures: No procedures performed   Clinical Data: No additional findings.  Objective: Vital Signs: There were no vitals taken for this visit.  Physical Exam:   Constitutional: Patient appears well-developed HEENT:  Head: Normocephalic Eyes:EOM are normal Neck: Normal range of motion Cardiovascular: Normal rate Pulmonary/chest: Effort normal Neurologic: Patient is alert Skin: Skin is warm Psychiatric: Patient has normal mood and affect    Ortho Exam: Ortho exam demonstrates full active and passive range of motion of the ankle.  Knee lacks about 10 degrees of full flexion on the right compared to the left.  Foot progression angle is about 8 degrees different right versus left.  He has palpable intact nontender anterior to posterior to peroneal Achilles tendons with no effusion in that right knee.  Extensor  mechanism is intact and nontender.  Specialty Comments:  No specialty comments available.  Imaging: Xr Ankle Complete Right  Result Date: 03/27/2019 AP lateral mortise right ankle reviewed.  Mortise is symmetric.  No significant arthritis is present.  No acute fracture noted.  Tib-fib fracture distally appears healed with good callus formation and early remodeling.  No evidence of loosening or lucency around the hardware.  Xr Knee 1-2 Views Right  Result Date: 03/27/2019 AP lateral right knee reviewed.  Intramedullary tibial nail is present in good position alignment.  Knee itself has no arthritis or acute fracture.  Mild patellofemoral arthritis is  observed.  Xr Tibia/fibula Right  Result Date: 03/27/2019 AP lateral right tib-fib reviewed.  Intramedullary nail transfixes tib-fib fracture.  Callus formation and healing is present in both the tibia and the fibula.    PMFS History: Patient Active Problem List   Diagnosis Date Noted   Tibial fracture 06/01/2018   Closed fracture of right fibula and tibia    Past Medical History:  Diagnosis Date   Hypertension    Hyperthyroidism    Sleep apnea     History reviewed. No pertinent family history.  Past Surgical History:  Procedure Laterality Date   APPENDECTOMY     TIBIA IM NAIL INSERTION Right 06/01/2018   Procedure: INTRAMEDULLARY (IM) NAIL TIBIAL;  Surgeon: Cammy Copa, MD;  Location[facility] OR;  Service: Orthopedics;  Laterality: Right;   Social History   Occupational History   Not on file  Tobacco Use   Smoking status: Never Smoker   Smokeless tobacco: Never Used  Substance and Sexual Activity   Alcohol use: Not on file   Drug use: Not on file   Sexual activity: Not on file

## 2019-03-28 ENCOUNTER — Telehealth: Payer: Self-pay | Admitting: Orthopedic Surgery

## 2019-03-28 NOTE — Telephone Encounter (Signed)
Reg duty

## 2019-03-28 NOTE — Telephone Encounter (Signed)
Please advise. Thanks.  

## 2019-03-28 NOTE — Telephone Encounter (Signed)
[employer] called stated they were given a note but need it to specify what type of duty,etc.  Please call Efraim Kaufmann [phone]

## 2019-03-28 NOTE — Telephone Encounter (Signed)
I tried calling, no answer. LM for Melissa advising per Dr August Saucer.

## 2019-03-29 ENCOUNTER — Telehealth: Payer: Self-pay

## 2019-03-29 NOTE — Telephone Encounter (Signed)
[employer] case mgr for patient called back stating patients adjustor asked for new note stating ok to return to work on 06/01 specifying no restrictions.  I generated note and faxed to her as requested to [phone]

## 2019-06-19 ENCOUNTER — Encounter: Payer: Self-pay | Admitting: Orthopedic Surgery

## 2019-06-19 ENCOUNTER — Other Ambulatory Visit: Payer: Self-pay

## 2019-06-19 ENCOUNTER — Ambulatory Visit (INDEPENDENT_AMBULATORY_CARE_PROVIDER_SITE_OTHER): Payer: No Typology Code available for payment source | Admitting: Orthopedic Surgery

## 2019-06-19 ENCOUNTER — Ambulatory Visit (INDEPENDENT_AMBULATORY_CARE_PROVIDER_SITE_OTHER): Payer: No Typology Code available for payment source

## 2019-06-19 DIAGNOSIS — S82201D Unspecified fracture of shaft of right tibia, subsequent encounter for closed fracture with routine healing: Secondary | ICD-10-CM | POA: Diagnosis not present

## 2019-06-19 DIAGNOSIS — S82401D Unspecified fracture of shaft of right fibula, subsequent encounter for closed fracture with routine healing: Secondary | ICD-10-CM

## 2019-06-19 NOTE — Progress Notes (Signed)
Office Visit Note   Patient: Carlos Adkins           Date of Birth: [birth year]           MRN: [id number] Visit Date: 06/19/2019 Requested by: Evelene Croon, MD [address] [place],  [place] [address] PCP: Evelene Croon, MD  Subjective: Chief Complaint  Patient presents with  . Right Leg - Follow-up    HPI: Carlos Adkins is a patient who is about a year out right IM nail for tib-fib fracture.  He has had a long rehab course because his healing was delayed due to low vitamin D.  He did get back to work on 04/17/2019.  Difficult for the first few weeks.  He reports continued symptoms of pain at times.  Also reports fatigue in that right leg.  He does propane delivery.  He has had 2 falls since going back to work.  Both of those were at home.              ROS: All systems reviewed are negative as they relate to the chief complaint within the history of present illness.  Patient denies  fevers or chills.   Assessment & Plan: Visit Diagnoses:  1. Closed fracture of right tibia and fibula with routine healing, subsequent encounter     Plan: Impression is right tibial IM nail with delayed healing but now the fracture looks good.  He does have about a 10 degree difference in foot progression angle right versus left.  I do not think that is worth doing any type of osteotomy for.  Plan is continue with leg strengthening exercises.  I do not think he needs formal physical therapy for that but I did recommend that he consider doing stationary bike and leg extension exercises.  I think in general most people would be at maximal medical improvement by now but because of his delayed weightbearing and rehab due to of his delayed healing will give him 4 more months and then I will write and release him at that time.  Continue with work at his current level of duty.  Come back in 4 months for recheck.  No radiographs needed at that time  Follow-Up Instructions: Return in about 4 months (around  10/19/2019).   Orders:  Orders Placed This Encounter  Procedures  . XR Tibia/Fibula Right   No orders of the defined types were placed in this encounter.     Procedures: No procedures performed   Clinical Data: No additional findings.  Objective: Vital Signs: There were no vitals taken for this visit.  Physical Exam:   Constitutional: Patient appears well-developed HEENT:  Head: Normocephalic Eyes:EOM are normal Neck: Normal range of motion Cardiovascular: Normal rate Pulmonary/chest: Effort normal Neurologic: Patient is alert Skin: Skin is warm Psychiatric: Patient has normal mood and affect    Ortho Exam: Ortho exam demonstrates full active and passive range of motion of that right ankle and knee.  No right knee effusion.  Collateral crucial ligaments stable in the knee.  No real tenderness over any of the interlocking screw sites.  Not much in the way of pain with torque.  Gait is slightly antalgic to the right and he has about 1 cm of quad atrophy right versus left.  Specialty Comments:  No specialty comments available.  Imaging: Xr Tibia/fibula Right  Result Date: 06/19/2019 AP lateral right tib-fib reviewed.  Distal tib-fib fracture healing in good position alignment.  Callus formation is noted.  No loosening of hardware.  No hardware complications.  Impression is healed right tib-fib fracture    PMFS History: Patient Active Problem List   Diagnosis Date Noted  . Tibial fracture 06/01/2018  . Closed fracture of right fibula and tibia    Past Medical History:  Diagnosis Date  . Hypertension   . Hyperthyroidism   . Sleep apnea     History reviewed. No pertinent family history.  Past Surgical History:  Procedure Laterality Date  . APPENDECTOMY    . TIBIA IM NAIL INSERTION Right 06/01/2018   Procedure: INTRAMEDULLARY (IM) NAIL TIBIAL;  Surgeon: Cammy Copaean, Miyana Mordecai Scott, MD;  [facility] OR;  Service: Orthopedics;  Laterality: Right;   Social History    Occupational History  . Not on file  Tobacco Use  . Smoking status: Never Smoker  . Smokeless tobacco: Never Used  Substance and Sexual Activity  . Alcohol use: Not on file  . Drug use: Not on file  . Sexual activity: Not on file

## 2019-06-21 ENCOUNTER — Telehealth: Payer: Self-pay

## 2019-06-21 NOTE — Telephone Encounter (Signed)
Faxed 06/19/19 office note to case mgr per her request

## 2019-06-26 ENCOUNTER — Telehealth: Payer: Self-pay

## 2019-06-26 NOTE — Telephone Encounter (Signed)
Faxed the 06/19/19 office note to case mgr again. She states she did not receive the first fax

## 2019-08-23 ENCOUNTER — Ambulatory Visit (INDEPENDENT_AMBULATORY_CARE_PROVIDER_SITE_OTHER): Payer: No Typology Code available for payment source

## 2019-08-23 ENCOUNTER — Ambulatory Visit (INDEPENDENT_AMBULATORY_CARE_PROVIDER_SITE_OTHER): Payer: No Typology Code available for payment source | Admitting: Orthopedic Surgery

## 2019-08-23 ENCOUNTER — Encounter: Payer: Self-pay | Admitting: Orthopedic Surgery

## 2019-08-23 DIAGNOSIS — M545 Low back pain, unspecified: Secondary | ICD-10-CM

## 2019-08-25 ENCOUNTER — Encounter: Payer: Self-pay | Admitting: Orthopedic Surgery

## 2019-08-25 NOTE — Progress Notes (Signed)
Office Visit Note   Patient: Carlos Adkins           Date of Birth: 23-Dec-1960           MRN: [id number] Visit Date: 08/23/2019 Requested by: Evelene Croon, MD [address] [place],   [id number] PCP: Evelene Croon, MD  Subjective: Chief Complaint  Patient presents with  . Right Leg - Pain, Weakness, Follow-up    HPI: Arafat is a patient who presents now for follow-up of right tibia fracture.  Not much is changed.  Does report some constant pain.  Currently he is working full duty.  Pain is worsening weightbears.  He does work at his own pace.  Has been having some low back pain and right hip pain.  Does report some radiating symptoms from his back.              ROS: All systems reviewed are negative as they relate to the chief complaint within the history of present illness.  Patient denies  fevers or chills.   Assessment & Plan: Visit Diagnoses:  1. Low back pain, unspecified back pain laterality, unspecified chronicity, unspecified whether sciatica present     Plan: Impression is patient has reached maximal medical improvement following his right tibial shaft fracture.  The fracture is healed.  He does have about a 10 degree external rotation deformity on the right leg compared to the left.  In general the leg is strong.  I think some of these other symptoms may be coming from his back more than his hip.  Hip looks pretty reasonable on x-ray today.  Back may have a bulging disc which is giving him some of these radicular symptoms.  Negative Tinel's over the distal incision so I do not think hardware removal indicated at this time.  He is rated at 15% of his right leg due to mild malunion of fracture and continued symptoms..  Follow-up with me as needed.  Follow-Up Instructions: Return if symptoms worsen or fail to improve.   Orders:  Orders Placed This Encounter  Procedures  . XR HIP UNILAT W OR W/O PELVIS 2-3 VIEWS LEFT  . XR Lumbar Spine 2-3 Views   No  orders of the defined types were placed in this encounter.     Procedures: No procedures performed   Clinical Data: No additional findings.  Objective: Vital Signs: There were no vitals taken for this visit.  Physical Exam:   Constitutional: Patient appears well-developed HEENT:  Head: Normocephalic Eyes:EOM are normal Neck: Normal range of motion Cardiovascular: Normal rate Pulmonary/chest: Effort normal Neurologic: Patient is alert Skin: Skin is warm Psychiatric: Patient has normal mood and affect    Ortho Exam: Ortho exam demonstrates normal gait and alignment with good ankle dorsiflexion plantarflexion strength.  Negative Tinel's over the interlocking screw incisions.  Knee range of motion is full without effusion.  Does have slightly diminished internal rotation on the right hip compared to the left by about 15 degrees.  No nerve root tension signs.  No trochanteric tenderness today.  Specialty Comments:  No specialty comments available.  Imaging: No results found.   PMFS History: Patient Active Problem List   Diagnosis Date Noted  . Tibial fracture 06/01/2018  . Closed fracture of right fibula and tibia    Past Medical History:  Diagnosis Date  . Hypertension   . Hyperthyroidism   . Sleep apnea     History reviewed. No pertinent family history.  Past Surgical  History:  Procedure Laterality Date  . APPENDECTOMY    . TIBIA IM NAIL INSERTION Right 06/01/2018   Procedure: INTRAMEDULLARY (IM) NAIL TIBIAL;  Surgeon: Cammy Copa, MD;  Location: [facility] OR;  Service: Orthopedics;  Laterality: Right;   Social History   Occupational History  . Not on file  Tobacco Use  . Smoking status: Never Smoker  . Smokeless tobacco: Never Used  Substance and Sexual Activity  . Alcohol use: Not on file  . Drug use: Not on file  . Sexual activity: Not on file

## 2019-10-19 ENCOUNTER — Ambulatory Visit: Payer: Self-pay | Admitting: Orthopedic Surgery
